# Patient Record
Sex: Female | Born: 1960 | Race: Black or African American | Hispanic: No | Marital: Single | Smoking: Current every day smoker
Health system: Southern US, Community
[De-identification: ages and names within clinical notes are randomized; demographics above are authoritative.]

## PROBLEM LIST (undated history)

## (undated) DIAGNOSIS — J45909 Unspecified asthma, uncomplicated: Secondary | ICD-10-CM

## (undated) DIAGNOSIS — I10 Essential (primary) hypertension: Secondary | ICD-10-CM

## (undated) DIAGNOSIS — K219 Gastro-esophageal reflux disease without esophagitis: Secondary | ICD-10-CM

## (undated) HISTORY — PX: TOE SURGERY: SHX1073

## (undated) HISTORY — PX: ABDOMINAL HYSTERECTOMY: SHX81

---

## 2006-01-09 ENCOUNTER — Ambulatory Visit: Payer: Self-pay | Admitting: Family Medicine

## 2006-05-04 ENCOUNTER — Ambulatory Visit: Payer: Self-pay | Admitting: Internal Medicine

## 2006-05-11 ENCOUNTER — Ambulatory Visit (HOSPITAL_COMMUNITY): Admission: RE | Admit: 2006-05-11 | Discharge: 2006-05-11 | Payer: Self-pay | Admitting: Family Medicine

## 2006-05-20 ENCOUNTER — Other Ambulatory Visit: Admission: RE | Admit: 2006-05-20 | Discharge: 2006-05-20 | Payer: Self-pay | Admitting: Family Medicine

## 2006-05-20 ENCOUNTER — Ambulatory Visit: Payer: Self-pay | Admitting: Family Medicine

## 2006-05-20 ENCOUNTER — Encounter (INDEPENDENT_AMBULATORY_CARE_PROVIDER_SITE_OTHER): Payer: Self-pay | Admitting: Family Medicine

## 2006-06-01 ENCOUNTER — Encounter: Admission: RE | Admit: 2006-06-01 | Discharge: 2006-06-01 | Payer: Self-pay | Admitting: Family Medicine

## 2007-06-28 ENCOUNTER — Ambulatory Visit (HOSPITAL_COMMUNITY): Admission: RE | Admit: 2007-06-28 | Discharge: 2007-06-28 | Payer: Self-pay | Admitting: Family Medicine

## 2007-08-03 ENCOUNTER — Ambulatory Visit: Payer: Self-pay | Admitting: Internal Medicine

## 2007-09-17 ENCOUNTER — Ambulatory Visit: Payer: Self-pay | Admitting: Internal Medicine

## 2007-09-20 ENCOUNTER — Ambulatory Visit: Payer: Self-pay | Admitting: Internal Medicine

## 2010-03-10 ENCOUNTER — Emergency Department (HOSPITAL_COMMUNITY): Admission: EM | Admit: 2010-03-10 | Discharge: 2010-03-10 | Payer: Self-pay | Admitting: Family Medicine

## 2010-06-06 ENCOUNTER — Other Ambulatory Visit (HOSPITAL_COMMUNITY): Payer: Self-pay | Admitting: Family Medicine

## 2010-06-06 DIAGNOSIS — Z1231 Encounter for screening mammogram for malignant neoplasm of breast: Secondary | ICD-10-CM

## 2010-06-06 DIAGNOSIS — Z1239 Encounter for other screening for malignant neoplasm of breast: Secondary | ICD-10-CM

## 2010-06-24 ENCOUNTER — Ambulatory Visit (HOSPITAL_COMMUNITY)
Admission: RE | Admit: 2010-06-24 | Discharge: 2010-06-24 | Disposition: A | Discharge status: Home / Self Care | Payer: Self-pay | Source: Ambulatory Visit | Attending: Family Medicine | Admitting: Family Medicine

## 2010-06-24 ENCOUNTER — Encounter (HOSPITAL_COMMUNITY): Payer: Self-pay

## 2010-06-24 DIAGNOSIS — D739 Disease of spleen, unspecified: Secondary | ICD-10-CM | POA: Insufficient documentation

## 2010-06-24 DIAGNOSIS — Z1231 Encounter for screening mammogram for malignant neoplasm of breast: Secondary | ICD-10-CM | POA: Insufficient documentation

## 2010-06-24 DIAGNOSIS — D1779 Benign lipomatous neoplasm of other sites: Secondary | ICD-10-CM | POA: Insufficient documentation

## 2010-06-24 DIAGNOSIS — J9819 Other pulmonary collapse: Secondary | ICD-10-CM | POA: Insufficient documentation

## 2010-06-24 MED ORDER — IOHEXOL 300 MG/ML  SOLN
100.0000 mL | Freq: Once | INTRAMUSCULAR | Status: AC | PRN
  Administered 2010-06-24: 85 mL via INTRAVENOUS

## 2011-01-14 ENCOUNTER — Inpatient Hospital Stay (INDEPENDENT_AMBULATORY_CARE_PROVIDER_SITE_OTHER)
Admission: RE | Admit: 2011-01-14 | Discharge: 2011-01-14 | Disposition: A | Discharge status: Home / Self Care | Payer: Self-pay | Source: Ambulatory Visit | Attending: Family Medicine | Admitting: Family Medicine

## 2011-01-14 DIAGNOSIS — M461 Sacroiliitis, not elsewhere classified: Secondary | ICD-10-CM

## 2011-08-11 ENCOUNTER — Emergency Department (INDEPENDENT_AMBULATORY_CARE_PROVIDER_SITE_OTHER)
Admission: EM | Admit: 2011-08-11 | Discharge: 2011-08-11 | Disposition: A | Discharge status: Home / Self Care | Payer: Self-pay | Source: Home / Self Care

## 2011-08-11 ENCOUNTER — Encounter (HOSPITAL_COMMUNITY): Payer: Self-pay

## 2011-08-11 DIAGNOSIS — J209 Acute bronchitis, unspecified: Secondary | ICD-10-CM

## 2011-08-11 DIAGNOSIS — D1809 Hemangioma of other sites: Secondary | ICD-10-CM

## 2011-08-11 DIAGNOSIS — D1803 Hemangioma of intra-abdominal structures: Secondary | ICD-10-CM

## 2011-08-11 MED ORDER — BENZONATATE 100 MG PO CAPS: ORAL_CAPSULE | ORAL | Status: AC

## 2011-08-11 MED ORDER — BENZONATATE 100 MG PO CAPS: ORAL_CAPSULE | ORAL | Status: DC

## 2011-08-11 MED ORDER — SULFAMETHOXAZOLE-TRIMETHOPRIM 800-160 MG PO TABS: 1.0000 | ORAL_TABLET | Freq: Two times a day (BID) | ORAL | Status: AC

## 2011-08-11 MED ORDER — SULFAMETHOXAZOLE-TRIMETHOPRIM 800-160 MG PO TABS: 1.0000 | ORAL_TABLET | Freq: Two times a day (BID) | ORAL | Status: DC

## 2011-08-11 NOTE — ED Notes (Addendum)
C/o 5 day duration of cough, beginning to be productive yesterday (green w red streaks) had come down for [place] a few days ago, and thought it was a change in the weather making her have cough. No known ill contacts

## 2011-08-11 NOTE — Discharge Instructions (Signed)
Increase fluids and rest. Tylenol or Ibuprofen as needed. As we discussed, you should have a follow up CT scan. The CT scan you had Feb 2012 showed a probable benign lesion (non-cancerous) on your spleen. We will contact [facility] to schedule this for you.  Return as needed. Stop smoking!   Acute Bronchitis Bronchitis is when the organs and tissues involved in breathing get puffy (swollen) and can leak fluid. This makes it harder for air to get in and out of the lungs. You may cough a lot and produce thick spit (mucus). Acute means the illness started suddenly. HOME CARE  Rest.   Drink enough fluids to keep the pee (urine) clear or pale yellow.   Medicines may be given that will open up your airways to help you breathe better. Only take medicine as told by your doctor.   Use a cool mist vaporizer. This will help to thin any thick spit.   Do not smoke. Avoid secondhand smoke.  GET HELP RIGHT AWAY IF:   You have a temperature by mouth above 102 F (38.9 C), not controlled by medicine.   You have chills.   You develop severe shortness of breath or chest pain.   You have bloody spit mixed with mucus (sputum).   You throw up (vomit) often.   You lose too much body fluid (dehydrated).   You have a severe headache.   You feel faint.   You do not improve after 1 week of treatment.  MAKE SURE YOU:   Understand these instructions.   Will watch your condition.   Will get help right away if you are not doing well or get worse.  Document Released: 10/08/2007 Document Revised: 04/10/2011 Document Reviewed: 05/09/2009 [facility] Patient Information 2012 [place], [place].

## 2011-08-11 NOTE — ED Provider Notes (Signed)
Medical screening examination/treatment/procedure(s) were performed by non-physician practitioner and as supervising physician I was immediately available for consultation/collaboration.  Karely Hurtado   Dhyana Bastone, MD 08/11/11 1325 

## 2011-08-11 NOTE — ED Provider Notes (Signed)
History     CSN: [id number]  Arrival date & time 08/11/11  4098   None     Chief Complaint  Patient presents with  . Cough    (Consider location/radiation/quality/duration/timing/severity/associated sxs/prior treatment) HPI Comments: Patient presents today with complaints of sore throat and cough. Symptoms began a five days ago and are progressively worsening. She denies nasal congestion or postnasal drainage. She states her sore throat is worse with coughing and swallowing. Her cough today has become productive with yellowish green sputum. She has been taking Mucinex and NyQuil without improvement. She is a smoker. She denies fever.   History reviewed. No pertinent past medical history.  History reviewed. No pertinent past surgical history.  History reviewed. No pertinent family history.  History  Substance Use Topics  . Smoking status: Current Everyday Smoker  . Smokeless tobacco: Not on file  . Alcohol Use: Yes    OB History    Grav Para Term Preterm Abortions TAB SAB Ect Mult Living                  Review of Systems  Constitutional: Negative for fever, chills and fatigue.  HENT: Positive for sore throat. Negative for ear pain, congestion, rhinorrhea and sinus pressure.   Respiratory: Positive for cough. Negative for shortness of breath and wheezing.     Allergies  Review of patient's allergies indicates no known allergies.  Home Medications   Current Outpatient Rx  Name Route Sig Dispense Refill  . BENZONATATE 100 MG PO CAPS  1-2 caps every 8 hrs prn cough 30 capsule 0  . SULFAMETHOXAZOLE-TRIMETHOPRIM 800-160 MG PO TABS Oral Take 1 tablet by mouth every 12 (twelve) hours. 14 tablet 0    BP 119/80  Pulse 88  Temp(Src) 98.5 F (36.9 C) (Oral)  Resp 14  SpO2 100%  Physical Exam  Nursing note and vitals reviewed. Constitutional: She appears well-developed and well-nourished. No distress.  HENT:  Head: Normocephalic and atraumatic.  Right Ear: Tympanic  membrane, external ear and ear canal normal.  Left Ear: Tympanic membrane, external ear and ear canal normal.  Nose: Nose normal.  Mouth/Throat: Uvula is midline, oropharynx is clear and moist and mucous membranes are normal. No oropharyngeal exudate, posterior oropharyngeal edema or posterior oropharyngeal erythema.  Neck: Neck supple.  Cardiovascular: Normal rate, regular rhythm and normal heart sounds.   Pulmonary/Chest: Effort normal and breath sounds normal. No respiratory distress.  Lymphadenopathy:    She has no cervical adenopathy.  Neurological: She is alert.  Skin: Skin is warm and dry.  Psychiatric: She has a normal mood and affect.    ED Course  Procedures (including critical care time)  Labs Reviewed - No data to display No results found.   1. Acute bronchitis   2. Hemangioma of spleen       MDM  Previous chart reviewed. CT chest 06/24/10 reviewed & noted to have recommended f/u in 6 mos. Pt states CT was originally ordered by [facility]. She has not had recent appt with them and has not had f/u CT ordered.  Will contact [facility] to order f/u CT chest.         Melody Comas, PA 08/11/11 1143

## 2011-08-12 ENCOUNTER — Telehealth (HOSPITAL_COMMUNITY): Payer: Self-pay | Admitting: *Deleted

## 2011-08-12 NOTE — ED Notes (Signed)
Pt. called back and informed of appointment tomorrow at [facility] and instructed what to bring. I asked pt. if her orange card is up to date and she said it is. I told her I would fax the CT report over to [facility] to make sure they have it. Pt. voiced understanding. Vassie Moselle 08/12/2011

## 2011-08-12 NOTE — ED Notes (Signed)
Referral request with appointment date and time and copy of CT results from 06/24/2010 faxed to [facility].  Confirmation received. Vassie Moselle 08/12/2011]

## 2011-08-12 NOTE — ED Notes (Signed)
4/8 Michiel Cowboy RN asked me to contact [facility] to order a f/u chest CT with contrast. This was requested by Esperanza Sheets PA.  Pt. had CT chest with contrast 06/24/10 and pt. told PA no one told her she was supposed to get it rechecked in 6 mos. 4/9 I called the back desk at [facility] and she said pt. has not been seen in over 1 year.  Their doctor would not feel comfortable ordering it without seeing the pt. No appointments available until May. She transferred me to [facility] the triage RN. Appointment scheduled for 4/10 @ 0945. Tell pt. to bring all of her medicines with her and her orange card. If card is not up to date she would have to pay $75.00. I called pt. and left message to call. Vassie Moselle 08/12/2011

## 2011-08-13 ENCOUNTER — Other Ambulatory Visit (HOSPITAL_COMMUNITY): Payer: Self-pay | Admitting: Family Medicine

## 2011-08-13 DIAGNOSIS — Z1231 Encounter for screening mammogram for malignant neoplasm of breast: Secondary | ICD-10-CM

## 2011-08-18 ENCOUNTER — Ambulatory Visit (HOSPITAL_COMMUNITY)
Admission: RE | Admit: 2011-08-18 | Discharge: 2011-08-18 | Disposition: A | Discharge status: Home / Self Care | Payer: Self-pay | Source: Ambulatory Visit | Attending: Family Medicine | Admitting: Family Medicine

## 2011-08-18 DIAGNOSIS — D1779 Benign lipomatous neoplasm of other sites: Secondary | ICD-10-CM | POA: Insufficient documentation

## 2011-08-18 DIAGNOSIS — D739 Disease of spleen, unspecified: Secondary | ICD-10-CM | POA: Insufficient documentation

## 2011-08-18 LAB — CREATININE, SERUM
Creatinine, Ser: 0.74 mg/dL (ref 0.50–1.10)
GFR calc non Af Amer: >90 mL/min (ref >90)

## 2011-08-18 LAB — BUN: BUN: 12 mg/dL (ref 6–23)

## 2011-08-18 MED ORDER — IOHEXOL 300 MG/ML  SOLN
100.0000 mL | Freq: Once | INTRAMUSCULAR | Status: AC | PRN
  Administered 2011-08-18: 85 mL via INTRAVENOUS

## 2011-09-09 ENCOUNTER — Ambulatory Visit (HOSPITAL_COMMUNITY)
Admission: RE | Admit: 2011-09-09 | Discharge: 2011-09-09 | Disposition: A | Discharge status: Home / Self Care | Payer: Self-pay | Source: Ambulatory Visit | Attending: Family Medicine | Admitting: Family Medicine

## 2011-09-09 DIAGNOSIS — Z1231 Encounter for screening mammogram for malignant neoplasm of breast: Secondary | ICD-10-CM | POA: Insufficient documentation

## 2012-07-23 ENCOUNTER — Emergency Department (HOSPITAL_COMMUNITY): Payer: BC Managed Care – PPO

## 2012-07-23 ENCOUNTER — Observation Stay (HOSPITAL_COMMUNITY)
Admission: EM | Admit: 2012-07-23 | Discharge: 2012-07-26 | Disposition: A | Discharge status: Home / Self Care | Payer: BC Managed Care – PPO | Attending: Internal Medicine | Admitting: Internal Medicine

## 2012-07-23 ENCOUNTER — Encounter (HOSPITAL_COMMUNITY): Payer: Self-pay | Admitting: Emergency Medicine

## 2012-07-23 DIAGNOSIS — R0602 Shortness of breath: Secondary | ICD-10-CM | POA: Insufficient documentation

## 2012-07-23 DIAGNOSIS — R079 Chest pain, unspecified: Principal | ICD-10-CM | POA: Diagnosis present

## 2012-07-23 DIAGNOSIS — K220 Achalasia of cardia: Secondary | ICD-10-CM

## 2012-07-23 DIAGNOSIS — R9431 Abnormal electrocardiogram [ECG] [EKG]: Secondary | ICD-10-CM

## 2012-07-23 DIAGNOSIS — R799 Abnormal finding of blood chemistry, unspecified: Secondary | ICD-10-CM | POA: Insufficient documentation

## 2012-07-23 DIAGNOSIS — R1319 Other dysphagia: Secondary | ICD-10-CM

## 2012-07-23 DIAGNOSIS — R6889 Other general symptoms and signs: Secondary | ICD-10-CM | POA: Insufficient documentation

## 2012-07-23 DIAGNOSIS — F17200 Nicotine dependence, unspecified, uncomplicated: Secondary | ICD-10-CM | POA: Insufficient documentation

## 2012-07-23 DIAGNOSIS — R933 Abnormal findings on diagnostic imaging of other parts of digestive tract: Secondary | ICD-10-CM

## 2012-07-23 DIAGNOSIS — R05 Cough: Secondary | ICD-10-CM | POA: Insufficient documentation

## 2012-07-23 DIAGNOSIS — K219 Gastro-esophageal reflux disease without esophagitis: Secondary | ICD-10-CM | POA: Insufficient documentation

## 2012-07-23 DIAGNOSIS — R1310 Dysphagia, unspecified: Secondary | ICD-10-CM

## 2012-07-23 DIAGNOSIS — R059 Cough, unspecified: Secondary | ICD-10-CM | POA: Insufficient documentation

## 2012-07-23 HISTORY — DX: Unspecified asthma, uncomplicated: J45.909

## 2012-07-23 HISTORY — DX: Gastro-esophageal reflux disease without esophagitis: K21.9

## 2012-07-23 LAB — CBC WITH DIFFERENTIAL/PLATELET
Eosinophils Absolute: 0.3 10*3/uL (ref 0.0–0.7)
HCT: 39.2 % (ref 36.0–46.0)
Hemoglobin: 12.7 g/dL (ref 12.0–15.0)
Monocytes Absolute: 0.5 10*3/uL (ref 0.1–1.0)
Monocytes Relative: 4 % (ref 3–12)
Neutro Abs: 6.3 10*3/uL (ref 1.7–7.7)
Platelets: 280 10*3/uL (ref 150–400)
RDW: 14.2 % (ref 11.5–15.5)
WBC: 11.4 10*3/uL — ABNORMAL HIGH (ref 4.0–10.5)

## 2012-07-23 LAB — BASIC METABOLIC PANEL
Creatinine, Ser: 0.8 mg/dL (ref 0.50–1.10)
GFR calc Af Amer: >90 mL/min (ref >90)
Potassium: 4.6 mEq/L (ref 3.5–5.1)
Sodium: 137 mEq/L (ref 135–145)

## 2012-07-23 LAB — POCT I-STAT TROPONIN I: Troponin i, poc: 0.01 ng/mL (ref 0.00–0.08)

## 2012-07-23 MED ORDER — ONDANSETRON HCL 4 MG/2ML IJ SOLN: 4.0000 mg | Freq: Three times a day (TID) | INTRAMUSCULAR | Status: AC | PRN

## 2012-07-23 MED ORDER — MORPHINE SULFATE 2 MG/ML IJ SOLN
2.0000 mg | Freq: Once | INTRAMUSCULAR | Status: AC
  Administered 2012-07-23: 2 mg via INTRAVENOUS
  Filled 2012-07-23: qty 1

## 2012-07-23 MED ORDER — HEPARIN SODIUM (PORCINE) 5000 UNIT/ML IJ SOLN
5000.0000 [IU] | Freq: Three times a day (TID) | INTRAMUSCULAR | Status: AC
  Administered 2012-07-24 (×3): 5000 [IU] via SUBCUTANEOUS
  Filled 2012-07-23 (×4): qty 1

## 2012-07-23 MED ORDER — PANTOPRAZOLE SODIUM 40 MG PO TBEC
80.0000 mg | DELAYED_RELEASE_TABLET | Freq: Every day | ORAL | Status: DC
  Administered 2012-07-23 – 2012-07-26 (×4): 80 mg via ORAL
  Filled 2012-07-23 (×3): qty 2

## 2012-07-23 NOTE — ED Notes (Signed)
Onset 2 weeks ago while eating developed shortness of breath and chest pain for the past week coughing at night while laying down.  Seen a Doctor recently states diagnosed with bronchitis.

## 2012-07-23 NOTE — ED Provider Notes (Signed)
History     CSN: [id number]  Arrival date & time 07/23/12  1731   First MD Initiated Contact with Patient 07/23/12 1945      Chief Complaint  Patient presents with  . Chest Pain  . Shortness of Breath  . Cough    HPI Onset 2 weeks ago while eating developed shortness of breath and chest pain for the past week coughing at night while laying down. Seen a Doctor recently states diagnosed with bronchitis.  History reviewed. No pertinent past medical history.  History reviewed. No pertinent past surgical history.  No family history on file.  History  Substance Use Topics  . Smoking status: Current Every Day Smoker  . Smokeless tobacco: Not on file  . Alcohol Use: Yes    OB History   Grav Para Term Preterm Abortions TAB SAB Ect Mult Living                  Review of Systems  Respiratory: Positive for cough and choking.   All other systems reviewed and are negative.    Allergies  Review of patient's allergies indicates no known allergies.  Home Medications   Current Outpatient Rx  Name  Route  Sig  Dispense  Refill  . chlorpheniramine-HYDROcodone (TUSSIONEX PENNKINETIC ER) 10-8 MG/5ML LQCR   Oral   Take 5 mLs by mouth at bedtime as needed. For cough           BP 125/78  Pulse 73  Temp(Src) 98.7 F (37.1 C) (Oral)  Resp 20  Ht 5\' 5"  (1.651 m)  Wt 195 lb (88.451 kg)  BMI 32.45 kg/m2  SpO2 98%  Physical Exam  Nursing note and vitals reviewed. Constitutional: She is oriented to person, place, and time. She appears well-developed and well-nourished. No distress.  HENT:  Head: Normocephalic and atraumatic.  Eyes: Pupils are equal, round, and reactive to light.  Neck: Normal range of motion.  Cardiovascular: Normal rate and intact distal pulses.   Pulmonary/Chest: No respiratory distress.  Abdominal: Normal appearance. She exhibits no distension. There is no tenderness. There is no rebound.  Musculoskeletal: Normal range of motion.  Neurological: She  is alert and oriented to person, place, and time. No cranial nerve deficit.  Skin: Skin is warm and dry. No rash noted.  Psychiatric: She has a normal mood and affect. Her behavior is normal.    ED Course  Procedures (including critical care time)  Date: 07/23/2012  Rate: 80  Rhythm: normal sinus rhythm  QRS Axis: normal  Intervals: normal  ST/T Wave abnormalities: Inferior and lateral T-wave inversions  Conduction Disutrbances: none  Narrative Interpretation: Abnormal EKG     Labs Reviewed  CBC WITH DIFFERENTIAL - Abnormal; Notable for the following:    WBC 11.4 (*)    Lymphs Abs 4.2 (*)    All other components within normal limits  BASIC METABOLIC PANEL - Abnormal; Notable for the following:    Glucose, Bld 111 (*)    GFR calc non Af Amer 85 (*)    All other components within normal limits  POCT I-STAT TROPONIN I   Dg Chest 2 View  07/23/2012  *RADIOLOGY REPORT*  Clinical Data: Chest pain, shortness of breath and cough  CHEST - 2 VIEW  Comparison: 08/18/2011 chest CT  Findings: The cardiomediastinal silhouette is unremarkable. Mild right middle lobe and basilar scarring again noted. There is no evidence of focal airspace disease, pulmonary edema, suspicious pulmonary nodule/mass, pleural effusion, or pneumothorax. No acute  bony abnormalities are identified.  IMPRESSION: No evidence of acute cardiopulmonary disease.   Original Report Authenticated By: Harmon Pier, M.D.      1. Chest pain   2. Abnormal EKG       MDM          Nelia Shi, MD 07/23/12 2155

## 2012-07-23 NOTE — H&P (Signed)
[facility] History and Physical  Hailea Eaglin XBJ:[id number] DOB: [birth year] DOA: 07/23/2012  Referring physician: ED PCP: Jaclyn Shaggy, MD  Specialists: None  Chief Complaint: Chest pain, cough  HPI: Brooke Guerra is a 52 y.o. female who presents with a chronic history of 2 weeks or more of chest pain after eating, cough when lying down.  Eating makes the chest pain worse, and lying down makes the cough worse, nothing seems to make it better.  She states it is of quality like "food gets stuck in my throat".  She has not been treated with any antiacid medication as of yet nor had endoscopy.  In the ED troponin was negative, EKG did show inferior and lateral T wave inversions and no prior EKG was available for comparison.  Given this the patient admitted for cardiac rule out as well as barium swallow to see if she has esophageal dysfunction (ie stricture).  Review of Systems: 12 systems reviewed and otherwise negative.  History reviewed. No pertinent past medical history. Past Surgical History  Procedure Laterality Date  . Abdominal hysterectomy     Social History:  reports that she has been smoking.  She does not have any smokeless tobacco history on file. She reports that  drinks alcohol. She reports that she does not use illicit drugs.   No Known Allergies  History reviewed. No pertinent family history.  Prior to Admission medications   Medication Sig Start Date End Date Taking? Authorizing Provider  chlorpheniramine-HYDROcodone (TUSSIONEX PENNKINETIC ER) 10-8 MG/5ML LQCR Take 5 mLs by mouth at bedtime as needed. For cough   Yes Historical Provider, MD   Physical Exam: Filed Vitals:   07/23/12 1942 07/23/12 2000 07/23/12 2200 07/23/12 2321  BP: 139/77 125/78 110/68 158/90  Pulse: 69 73 72 89  Temp:    98.2 F (36.8 C)  TempSrc:    Oral  Resp: 14 20 23 18   Height:    5\' 4"  (1.626 m)  Weight:    87.272 kg (192 lb 6.4 oz)  SpO2: 98% 98% 97% 91%    General:  NAD,  resting comfortably in bed Eyes: PEERLA EOMI ENT: mucous membranes moist Neck: supple w/o JVD Cardiovascular: RRR w/o MRG Respiratory: CTA B Abdomen: soft, nt, nd, bs+ Skin: no rash nor lesion Musculoskeletal: MAE, full ROM all 4 extremities Psychiatric: normal tone and affect Neurologic: AAOx3, grossly non-focal  Labs on Admission:  Basic Metabolic Panel:  Recent Labs Lab 07/23/12 1753  NA 137  K 4.6  CL 100  CO2 27  GLUCOSE 111*  BUN 12  CREATININE 0.80  CALCIUM 9.0   Liver Function Tests: No results found for this basename: AST, ALT, ALKPHOS, BILITOT, PROT, ALBUMIN,  in the last 168 hours No results found for this basename: LIPASE, AMYLASE,  in the last 168 hours No results found for this basename: AMMONIA,  in the last 168 hours CBC:  Recent Labs Lab 07/23/12 1753  WBC 11.4*  NEUTROABS 6.3  HGB 12.7  HCT 39.2  MCV 85.2  PLT 280   Cardiac Enzymes: No results found for this basename: CKTOTAL, CKMB, CKMBINDEX, TROPONINI,  in the last 168 hours  BNP (last 3 results) No results found for this basename: PROBNP,  in the last 8760 hours CBG: No results found for this basename: GLUCAP,  in the last 168 hours  Radiological Exams on Admission: Dg Chest 2 View  07/23/2012  *RADIOLOGY REPORT*  Clinical Data: Chest pain, shortness of breath and cough  CHEST -  2 VIEW  Comparison: 08/18/2011 chest CT  Findings: The cardiomediastinal silhouette is unremarkable. Mild right middle lobe and basilar scarring again noted. There is no evidence of focal airspace disease, pulmonary edema, suspicious pulmonary nodule/mass, pleural effusion, or pneumothorax. No acute bony abnormalities are identified.  IMPRESSION: No evidence of acute cardiopulmonary disease.   Original Report Authenticated By: Harmon Pier, M.D.     EKG: Independently reviewed. No prior for comparison, does show inferior and lateral T wave inversions  Assessment/Plan Principal Problem:   Chest pain   1. Chest  pain - clinical history is strongly suspicious for GERD and possibly esophageal strictures.  Treating GERD empirically with PPI, to see if this improves her pain, ordered barium swallow to eval esophagus, as I discussed with patient however, she will likely need GI follow up (either consult during hospital stay or outpatient follow up) for possible EGD both to treat stricture if one is present and to make sure that there isnt any other process going on ie Barett's esophagus / neoplasm.  Have put patient on tele monitor and ordered serial troponins, and keeping NPO just in case we need to do a stress test tomorrow, but given history I doubt that this is cardiac.    Code Status: Full Code (must indicate code status--if unknown or must be presumed, indicate so) Family Communication: Spoke with family members at bedside (indicate person spoken with, if applicable, with phone number if by telephone) Disposition Plan: Admit to obs (indicate anticipated LOS)  Time spent: 50 min  Casen Pryor M. [facility] Pager [phone]  If 7PM-7AM, please contact night-coverage [email or web address] Password [facility] 07/23/2012, 11:29 PM

## 2012-07-24 ENCOUNTER — Observation Stay (HOSPITAL_COMMUNITY): Payer: BC Managed Care – PPO

## 2012-07-24 ENCOUNTER — Other Ambulatory Visit: Payer: Self-pay

## 2012-07-24 DIAGNOSIS — R079 Chest pain, unspecified: Secondary | ICD-10-CM

## 2012-07-24 DIAGNOSIS — R1314 Dysphagia, pharyngoesophageal phase: Secondary | ICD-10-CM

## 2012-07-24 DIAGNOSIS — R933 Abnormal findings on diagnostic imaging of other parts of digestive tract: Secondary | ICD-10-CM

## 2012-07-24 LAB — BASIC METABOLIC PANEL
Calcium: 9 mg/dL (ref 8.4–10.5)
GFR calc non Af Amer: 72 mL/min — ABNORMAL LOW (ref >90)
Sodium: 141 mEq/L (ref 135–145)

## 2012-07-24 LAB — LIPID PANEL: Cholesterol: 185 mg/dL (ref 0–200)

## 2012-07-24 LAB — CBC
MCH: 27.1 pg (ref 26.0–34.0)
MCHC: 31.7 g/dL (ref 30.0–36.0)
Platelets: 239 10*3/uL (ref 150–400)

## 2012-07-24 LAB — TROPONIN I
Troponin I: <0.3 ng/mL (ref <0.30)
Troponin I: <0.3 ng/mL (ref <0.30)

## 2012-07-24 MED ORDER — GI COCKTAIL ~~LOC~~
30.0000 mL | Freq: Once | ORAL | Status: AC
  Administered 2012-07-24: 30 mL via ORAL
  Filled 2012-07-24: qty 30

## 2012-07-24 MED ORDER — NITROGLYCERIN 0.4 MG SL SUBL
0.4000 mg | SUBLINGUAL_TABLET | SUBLINGUAL | Status: DC | PRN
  Administered 2012-07-24 – 2012-07-25 (×2): 0.4 mg via SUBLINGUAL
  Filled 2012-07-24: qty 25

## 2012-07-24 MED ORDER — ACETAMINOPHEN 325 MG PO TABS
650.0000 mg | ORAL_TABLET | ORAL | Status: DC | PRN
  Administered 2012-07-24: 650 mg via ORAL
  Filled 2012-07-24: qty 2

## 2012-07-24 NOTE — Progress Notes (Signed)
[facility] PROGRESS NOTE  Ciin Brazzel ZOX:[id number] DOB: Aug 10, 1960 DOA: 07/23/2012 PCP: Jaclyn Shaggy, MD  Assessment/Plan: 1. Atypical chest pain: cardian enzymes negative. T WAVE inversions on ekg resolving. Echo pending.  2. GERD: on protonix. GI Consult called and plan for EGD in am.  3. dvt prophylaxis  Code Status: full code Family Communication: family at bedside Disposition Plan: possibly in 1 to 2 days.    Consultants:  GI consult called.   HPI/Subjective:   Objective: Filed Vitals:   07/23/12 2000 07/23/12 2200 07/23/12 2321 07/24/12 0548  BP: 125/78 110/68 158/90 138/84  Pulse: 73 72 89 66  Temp:   98.2 F (36.8 C) 98.2 F (36.8 C)  TempSrc:   Oral Oral  Resp: 20 23 18 16   Height:   5\' 4"  (1.626 m)   Weight:   87.272 kg (192 lb 6.4 oz) 86.864 kg (191 lb 8 oz)  SpO2: 98% 97% 91% 96%   No intake or output data in the 24 hours ending 07/24/12 1008 Filed Weights   07/23/12 1748 07/23/12 2321 07/24/12 0548  Weight: 88.451 kg (195 lb) 87.272 kg (192 lb 6.4 oz) 86.864 kg (191 lb 8 oz)    Exam: Alert afebrile comfortable.  Cardiovascular: RRR w/o MRG  Respiratory: CTA B  Abdomen: soft, nt, nd, bs+  Skin: no rash nor lesion  Msculoskeletal: MAE, full ROM all 4 extremities  Neurologic: AAOx3, grossly non-focal   Data Reviewed: Basic Metabolic Panel:  Recent Labs Lab 07/23/12 1753 07/24/12 0500  NA 137 141  K 4.6 3.7  CL 100 104  CO2 27 27  GLUCOSE 111* 99  BUN 12 13  CREATININE 0.80 0.92  CALCIUM 9.0 9.0   Liver Function Tests: No results found for this basename: AST, ALT, ALKPHOS, BILITOT, PROT, ALBUMIN,  in the last 168 hours No results found for this basename: LIPASE, AMYLASE,  in the last 168 hours No results found for this basename: AMMONIA,  in the last 168 hours CBC:  Recent Labs Lab 07/23/12 1753 07/24/12 0500  WBC 11.4* 10.1  NEUTROABS 6.3  --   HGB 12.7 11.9*  HCT 39.2 37.5  MCV 85.2 85.4  PLT 280 239   Cardiac  Enzymes:  Recent Labs Lab 07/23/12 2354 07/24/12 0500  TROPONINI <0.30 <0.30   BNP (last 3 results) No results found for this basename: PROBNP,  in the last 8760 hours CBG: No results found for this basename: GLUCAP,  in the last 168 hours  No results found for this or any previous visit (from the past 240 hour(s)).   Studies: Dg Chest 2 View  07/23/2012  *RADIOLOGY REPORT*  Clinical Data: Chest pain, shortness of breath and cough  CHEST - 2 VIEW  Comparison: 08/18/2011 chest CT  Findings: The cardiomediastinal silhouette is unremarkable. Mild right middle lobe and basilar scarring again noted. There is no evidence of focal airspace disease, pulmonary edema, suspicious pulmonary nodule/mass, pleural effusion, or pneumothorax. No acute bony abnormalities are identified.  IMPRESSION: No evidence of acute cardiopulmonary disease.   Original Report Authenticated By: Harmon Pier, M.D.     Scheduled Meds: . heparin  5,000 Units Subcutaneous Q8H  . pantoprazole  80 mg Oral Daily   Continuous Infusions:   Principal Problem:   Chest pain        [facility]  [facility] Pager [phone]. If 7PM-7AM, please contact night-coverage at [email or web address], password [facility] 07/24/2012, 10:08 AM  LOS: 1 day

## 2012-07-24 NOTE — Consult Note (Signed)
Referring Provider: [facility]  Primary Care Physician:  Jaclyn Shaggy, MD Primary Gastroenterologist:  None.  Reason for Consultation:  Chest pain and dysphagia  HPI: Brooke Guerra is a 52 y.o. female admitted last evening With complaints of chest pain, difficulty swallowing and coughing. She has history of what sounds like chronic bronchitis and is a smoker but has not had any other known chronic medical problems. She has not had any prior GI evaluation. Patient says she has noticed onset of heartburn over the past couple months which she had never had previously and then about 2 weeks ago started having difficulty swallowing. She says she's been having "gagging" with eating and a feeling of her food sitting in her chest for prolonged periods of time. She says she's having difficulty with solids and liquids and when she tries to each she gets a lot of gas L. Ting burping whether it's liquid or solid that she has swallowed . She gets discomfort and pain in her chest when the food sits for a long time.. has not been regurgitating during the day. She says at nighttime or if she lies down she has a lot of reflux and foamy fluid that comes back up in her mouth. She is being wakened from sleep coughing and with fluid coming up. Denies any abdominal pain. She says she's been eating less but is not sure about weight loss. She says the only chest pain that she has had has been associated with swallowing over the past 2 weeks. Initial evaluation in the ER Showed an abnormal EKG with some T wave inversion but no prior EKG for comparison. troponins are negative Barium swallow was done earlier today and this shows tight  narrowing at the GE junction. Esophageal motility proximally appears normal.    History reviewed. No pertinent past medical history.  Past Surgical History  Procedure Laterality Date  . Abdominal hysterectomy      Prior to Admission medications   Medication Sig Start Date End  Date Taking? Authorizing Provider  chlorpheniramine-HYDROcodone (TUSSIONEX PENNKINETIC ER) 10-8 MG/5ML LQCR Take 5 mLs by mouth at bedtime as needed. For cough   Yes Historical Provider, MD    Current Facility-Administered Medications  Medication Dose Route Frequency Provider Last Rate Last Dose  . acetaminophen (TYLENOL) tablet 650 mg  650 mg Oral Q4H PRN Kathlen Mody, MD      . heparin injection 5,000 Units  5,000 Units Subcutaneous Q8H Hillary Bow, DO   5,000 Units at 07/24/12 0609  . nitroGLYCERIN (NITROSTAT) SL tablet 0.4 mg  0.4 mg Sublingual Q5 min PRN Kathlen Mody, MD   0.4 mg at 07/24/12 0843  . pantoprazole (PROTONIX) EC tablet 80 mg  80 mg Oral Daily Hillary Bow, DO   80 mg at 07/24/12 1048    Allergies as of 07/23/2012  . (No Known Allergies)    History reviewed. No pertinent family history.  History   Social History  . Marital Status: Single    Spouse Name: N/A    Number of Children: N/A  . Years of Education: N/A   Occupational History  . Not on file.   Social History Main Topics  . Smoking status: Current Every Day Smoker -- 0.50 packs/day  . Smokeless tobacco: Not on file  . Alcohol Use: Yes  . Drug Use: No  . Sexually Active: Not on file   Other Topics Concern  . Not on file   Social History Narrative  . No narrative on  file    Review of Systems:  Pertinent positive and negative review of systems were noted in the above HPI section.  All other review of systems was otherwise negative.  Physical Exam: Vital signs in last 24 hours: Temp:  [98.1 F (36.7 C)-98.7 F (37.1 C)] 98.1 F (36.7 C) (03/22 1105) Pulse Rate:  [66-89] 82 (03/22 1105) Resp:  [14-23] 18 (03/22 1105) BP: (110-158)/(68-90) 138/84 mmHg (03/22 0548) SpO2:  [91 %-98 %] 96 % (03/22 0548) Weight:  [191 lb 8 oz (86.864 kg)-195 lb (88.451 kg)] 191 lb 8 oz (86.864 kg) (03/22 0548) Last BM Date: 07/24/12 General:   Alert,  Well-developed, well-nourished, African American  female, pleasant and cooperative in NAD Head:  Normocephalic and atraumatic. Eyes:  Sclera clear, no icterus.   Conjunctiva pink. Ears:  Normal auditory acuity. Nose:  No deformity, discharge,  or lesions. Mouth:  No deformity or lesions.   Neck:  Supple; no masses or thyromegaly. Lungs:  Clear throughout to auscultation.   No wheezes, crackles, or rhonchi. Heart:  Regular rate and rhythm; no murmurs, clicks, rubs,  or gallops. Abdomen:  Soft,nontender, BS active,nonpalp mass or hsm.   Rectal:  Deferred  Msk:  Symmetrical without gross deformities. . Pulses:  Normal pulses noted. Extremities:  Without clubbing or edema. Neurologic:  Alert and  oriented x4;  grossly normal neurologically. Skin:  Intact without significant lesions or rashes.. Psych:  Alert and cooperative. Normal mood and affect.  Intake/Output from previous day:   Intake/Output this shift:    Lab Results:  Recent Labs  07/23/12 1753 07/24/12 0500  WBC 11.4* 10.1  HGB 12.7 11.9*  HCT 39.2 37.5  PLT 280 239   BMET  Recent Labs  07/23/12 1753 07/24/12 0500  NA 137 141  K 4.6 3.7  CL 100 104  CO2 27 27  GLUCOSE 111* 99  BUN 12 13  CREATININE 0.80 0.92  CALCIUM 9.0 9.0   LFT No results found for this basename: PROT, ALBUMIN, AST, ALT, ALKPHOS, BILITOT, BILIDIR, IBILI,  in the last 72 hours PT/INR No results found for this basename: LABPROT, INR,  in the last 72 hours Hepatitis Panel No results found for this basename: HEPBSAG, HCVAB, HEPAIGM, HEPBIGM,  in the last 72 hours    Studies/Results: Dg Chest 2 View  07/23/2012  *RADIOLOGY REPORT*  Clinical Data: Chest pain, shortness of breath and cough  CHEST - 2 VIEW  Comparison: 08/18/2011 chest CT  Findings: The cardiomediastinal silhouette is unremarkable. Mild right middle lobe and basilar scarring again noted. There is no evidence of focal airspace disease, pulmonary edema, suspicious pulmonary nodule/mass, pleural effusion, or pneumothorax. No  acute bony abnormalities are identified.  IMPRESSION: No evidence of acute cardiopulmonary disease.   Original Report Authenticated By: Harmon Pier, M.D.    Dg Esophagus  07/24/2012  *RADIOLOGY REPORT*  Clinical Data: The patient, history of reflux  ESOPHAGUS/BARIUM SWALLOW/TABLET STUDY  Technique: Barium swallow was performed using 45 seconds of fluoroscopy.  Effervescent crystals were administered initially followed by thick and subsequently thin barium.  Comparison:  None.  Findings: There is normal esophageal motility.  The distal esophagus is significantly narrowed just above the gastroesophageal junction.  Barium passed through the area of persistent narrowing with relative these.  A barium tablet was administered and did not pass through the gastroesophageal junction despite numerous sips of water.  The remainder of the proximal esophagus is mildly to moderately dilated.  At the area of narrowing, there is no  significant mucosal irregularity to obviously suggest the presence of a mass.  IMPRESSION: Tight narrowing at the gastroesophageal junction and distal esophagus.  Differential diagnostic possibilities include achalasia, fibrosis from chronic gastroesophageal reflux, or, less likely, mass.  Consider further endoscopic evaluation.   Original Report Authenticated By: Esperanza Heir, M.D.     IMPRESSION:  #91 52 year old female with 2 week history of dysphagia both solids and liquids and associated chest pain, and nocturnal reflux and/or regurgitation . Barium swallow earlier today shows a tight narrowing at the GE junction. Rule out esophageal stricture, mass, versus achalasia. #2 abnormal EKG-, normal troponins #3 history bronchitis #4 smoker  PLAN: #1 will schedule patient for upper endoscopy with possible esophageal dilation tomorrow morning with Dr. Rhea Belton. Procedure was discussed in detail with the patient and she is agreeable to proceed. #2 change to a full liquid diet and n.p.o. after  midnight #3 PPI as ordered #4 patient is advised to sit upright for all meals and to remain upright for 1 hour postprandially and also keep the head of her bed elevated about 60 #5 patient may need further cardiac evaluation with stress testing etc.-Deferred to primary team. If it is felt that she needs further cardiac evaluation first before proceeding with EGD - please notify us. #6 she is on SQ heparin- I held this after midnight tonight   Amy Esterwood  07/24/2012, 11:33 AM

## 2012-07-24 NOTE — Consult Note (Signed)
Patient seen, examined, and I agree with the above documentation, including the assessment and plan. Agree with upper endoscopy, possible dilation, tomorrow. All achalasia is possible, motility per barium swallow was "normal" which is not consistent with achalasia. Endoscopy will help sort this out Agree with PPI

## 2012-07-24 NOTE — Progress Notes (Signed)
UR Completed Zavien Clubb Graves-Bigelow, RN,BSN [phone]  

## 2012-07-25 ENCOUNTER — Encounter (HOSPITAL_COMMUNITY)
Admission: EM | Disposition: A | Discharge status: Home / Self Care | Payer: Self-pay | Source: Home / Self Care | Attending: Emergency Medicine

## 2012-07-25 ENCOUNTER — Encounter (HOSPITAL_COMMUNITY): Payer: Self-pay | Admitting: *Deleted

## 2012-07-25 DIAGNOSIS — R1310 Dysphagia, unspecified: Secondary | ICD-10-CM

## 2012-07-25 DIAGNOSIS — R072 Precordial pain: Secondary | ICD-10-CM

## 2012-07-25 DIAGNOSIS — K220 Achalasia of cardia: Secondary | ICD-10-CM

## 2012-07-25 HISTORY — PX: ESOPHAGOGASTRODUODENOSCOPY (EGD) WITH ESOPHAGEAL DILATION: SHX5812

## 2012-07-25 SURGERY — ESOPHAGOGASTRODUODENOSCOPY (EGD) WITH ESOPHAGEAL DILATION
Anesthesia: Moderate Sedation

## 2012-07-25 MED ORDER — SODIUM CHLORIDE 0.9 % IV SOLN
INTRAVENOUS | Status: DC
  Administered 2012-07-25: 500 mL via INTRAVENOUS

## 2012-07-25 MED ORDER — MIDAZOLAM HCL 5 MG/ML IJ SOLN
INTRAMUSCULAR | Status: AC
  Filled 2012-07-25: qty 1

## 2012-07-25 MED ORDER — PANTOPRAZOLE SODIUM 40 MG PO TBEC: 40.0000 mg | DELAYED_RELEASE_TABLET | Freq: Two times a day (BID) | ORAL | Status: DC

## 2012-07-25 MED ORDER — MORPHINE SULFATE 2 MG/ML IJ SOLN
2.0000 mg | INTRAMUSCULAR | Status: DC | PRN
  Administered 2012-07-25 (×2): 2 mg via INTRAVENOUS
  Filled 2012-07-25 (×2): qty 1

## 2012-07-25 MED ORDER — FENTANYL CITRATE 0.05 MG/ML IJ SOLN
INTRAMUSCULAR | Status: AC
  Filled 2012-07-25: qty 2

## 2012-07-25 MED ORDER — BUTAMBEN-TETRACAINE-BENZOCAINE 2-2-14 % EX AERO
INHALATION_SPRAY | CUTANEOUS | Status: DC | PRN
  Administered 2012-07-25: 2 via TOPICAL

## 2012-07-25 MED ORDER — GI COCKTAIL ~~LOC~~
30.0000 mL | Freq: Once | ORAL | Status: AC
  Administered 2012-07-25: 30 mL via ORAL
  Filled 2012-07-25: qty 30

## 2012-07-25 MED ORDER — MIDAZOLAM HCL 10 MG/2ML IJ SOLN
INTRAMUSCULAR | Status: DC | PRN
  Administered 2012-07-25 (×2): 2 mg via INTRAVENOUS
  Administered 2012-07-25: 1 mg via INTRAVENOUS
  Administered 2012-07-25: 2 mg via INTRAVENOUS

## 2012-07-25 MED ORDER — FENTANYL CITRATE 0.05 MG/ML IJ SOLN
INTRAMUSCULAR | Status: DC | PRN
  Administered 2012-07-25 (×4): 25 ug via INTRAVENOUS

## 2012-07-25 NOTE — Progress Notes (Signed)
[facility] PROGRESS NOTE  Brooke Guerra YQM:[id number] DOB: 29-Jun-1960 DOA: 07/23/2012 PCP: Jaclyn Shaggy, MD  Assessment/Plan: 1. Atypical chest pain: cardian enzymes negative. T WAVE inversions on ekg resolved. Echo pending.  2. GERD: on protonix. GI Consult called and she underwent EGD this am. Her gastric mucosa is normal. She was found to have dilated and fluid filled esophagus and biopsies were obtained. Tight LES suspicious for achalasia, she will require manometry in the future to diagnose achalasia. Currently she has persistent substernal pain with belching, . Pain control and monitor.  3. dvt prophylaxis  Code Status: full code Family Communication: none at bedside. Disposition Plan: possibly in 1 to 2 days.    Consultants:  GI consult called.   HPI/Subjective: Substernal pain EKG No ischemic changes. Morphine ordered.   Objective: Filed Vitals:   07/25/12 0955 07/25/12 1000 07/25/12 1005 07/25/12 1044  BP: 129/61 124/80 131/80 161/93  Pulse:      Temp:      TempSrc:      Resp: 19 18 20    Height:      Weight:      SpO2: 99% 99% 95%     Intake/Output Summary (Last 24 hours) at 07/25/12 1251 Last data filed at 07/24/12 1413  Gross per 24 hour  Intake    120 ml  Output      0 ml  Net    120 ml   Filed Weights   07/23/12 1748 07/23/12 2321 07/24/12 0548  Weight: 88.451 kg (195 lb) 87.272 kg (192 lb 6.4 oz) 86.864 kg (191 lb 8 oz)    Exam: Alert afebrile in mild distress.  Cardiovascular: RRR w/o MRG  Respiratory: CTA B  Abdomen: soft, nt, nd, bs+  Skin: no rash nor lesion  Msculoskeletal: MAE, full ROM all 4 extremities  Neurologic: AAOx3, grossly non-focal   Data Reviewed: Basic Metabolic Panel:  Recent Labs Lab 07/23/12 1753 07/24/12 0500  NA 137 141  K 4.6 3.7  CL 100 104  CO2 27 27  GLUCOSE 111* 99  BUN 12 13  CREATININE 0.80 0.92  CALCIUM 9.0 9.0   Liver Function Tests: No results found for this basename: AST, ALT, ALKPHOS,  BILITOT, PROT, ALBUMIN,  in the last 168 hours No results found for this basename: LIPASE, AMYLASE,  in the last 168 hours No results found for this basename: AMMONIA,  in the last 168 hours CBC:  Recent Labs Lab 07/23/12 1753 07/24/12 0500  WBC 11.4* 10.1  NEUTROABS 6.3  --   HGB 12.7 11.9*  HCT 39.2 37.5  MCV 85.2 85.4  PLT 280 239   Cardiac Enzymes:  Recent Labs Lab 07/23/12 2354 07/24/12 0500 07/24/12 1100  TROPONINI <0.30 <0.30 <0.30   BNP (last 3 results) No results found for this basename: PROBNP,  in the last 8760 hours CBG: No results found for this basename: GLUCAP,  in the last 168 hours  No results found for this or any previous visit (from the past 240 hour(s)).   Studies: Dg Chest 2 View  07/23/2012  *RADIOLOGY REPORT*  Clinical Data: Chest pain, shortness of breath and cough  CHEST - 2 VIEW  Comparison: 08/18/2011 chest CT  Findings: The cardiomediastinal silhouette is unremarkable. Mild right middle lobe and basilar scarring again noted. There is no evidence of focal airspace disease, pulmonary edema, suspicious pulmonary nodule/mass, pleural effusion, or pneumothorax. No acute bony abnormalities are identified.  IMPRESSION: No evidence of acute cardiopulmonary disease.   Original Report Authenticated  By: Harmon Pier, M.D.    Dg Esophagus  07/24/2012  *RADIOLOGY REPORT*  Clinical Data: The patient, history of reflux  ESOPHAGUS/BARIUM SWALLOW/TABLET STUDY  Technique: Barium swallow was performed using 45 seconds of fluoroscopy.  Effervescent crystals were administered initially followed by thick and subsequently thin barium.  Comparison:  None.  Findings: There is normal esophageal motility.  The distal esophagus is significantly narrowed just above the gastroesophageal junction.  Barium passed through the area of persistent narrowing with relative these.  A barium tablet was administered and did not pass through the gastroesophageal junction despite numerous sips  of water.  The remainder of the proximal esophagus is mildly to moderately dilated.  At the area of narrowing, there is no significant mucosal irregularity to obviously suggest the presence of a mass.  IMPRESSION: Tight narrowing at the gastroesophageal junction and distal esophagus.  Differential diagnostic possibilities include achalasia, fibrosis from chronic gastroesophageal reflux, or, less likely, mass.  Consider further endoscopic evaluation.   Original Report Authenticated By: Esperanza Heir, M.D.     Scheduled Meds: . pantoprazole  80 mg Oral Daily   Continuous Infusions: . sodium chloride 500 mL (07/25/12 0854)    Principal Problem:   Chest pain Active Problems:   Dysphagia, unspecified        [name]  [facility] Pager [phone]. If 7PM-7AM, please contact night-coverage at [email or web address], password [facility] 07/25/2012, 12:51 PM  LOS: 2 days

## 2012-07-25 NOTE — Progress Notes (Signed)
*  PRELIMINARY RESULTS* Echocardiogram 2D Echocardiogram has been performed.  Brooke Guerra 07/25/2012, 12:17 PM

## 2012-07-25 NOTE — Progress Notes (Signed)
See EGD report Okay for d/c from GI standpoint.  Will arrange outpatient esophageal manometry to further investigate and rule out achalasia

## 2012-07-25 NOTE — Progress Notes (Signed)
Referral- PCP Provided pt with contact number for [facility] on dc instructions to call for PCP in this area that accept [employer]. Pt can call toll free number on insurance card and they will mail her a provider list. Isidoro Donning RN CCM Case Mgmt phone [phone]

## 2012-07-25 NOTE — Care Management Utilization Note (Signed)
UR completed 

## 2012-07-25 NOTE — Op Note (Signed)
[facility] [address] [place], [address]   ENDOSCOPY PROCEDURE REPORT  PATIENT: Brooke Guerra, Brooke Guerra  MR#: [id number] BIRTHDATE: 1961/04/11 , 51  yrs. old GENDER: Female ENDOSCOPIST: Beverley Fiedler, MD REFERRED BY:  Hospitalist, [facility] PROCEDURE DATE:  07/25/2012 PROCEDURE:  EGD w/ biopsy ASA CLASS:     Class II INDICATIONS:  Chest pain.   Dysphagia.   abnormal UGI series results. MEDICATIONS: These medications were titrated to patient response per physician's verbal order, Fentanyl 100 mcg IV, Versed, and Versed 7 mg IV TOPICAL ANESTHETIC: Cetacaine Spray  DESCRIPTION OF PROCEDURE: After the risks benefits and alternatives of the procedure were thoroughly explained, informed consent was obtained.  The Pentax Gastroscope [id number] endoscope was introduced through the mouth and advanced to the second portion of the duodenum. Without limitations.  The instrument was slowly withdrawn as the mucosa was fully examined.     ESOPHAGUS:  Dilated and fluid filled entire esophagus to the GE junction.  The esophageal mucosa was slightly edematous and granular likely secondary to fluid/food stasis.  The GE junction was located at approximately 40 cm from the incisors and mild to moderate resistance was met with passage of the standard adult upper endoscope.  No definite stricture was seen, though on withdrawal of the scope a small mucosal tear at the GE junction was noted.  There was no significant bleeding.   Random biopsies were obtained from the mid and distal esophagus with cold forceps given history of dysphagia.  STOMACH: The mucosa of the stomach appeared normal.  DUODENUM: The duodenal mucosa showed no abnormalities in the bulb and second portion of the duodenum.  Retroflexed views revealed no abnormalities.     The scope was then withdrawn from the patient and the procedure completed.  COMPLICATIONS: There were no complications.  ENDOSCOPIC  IMPRESSION: 1.   Dilated and fluid filled entire esophagus; random biopsies obtained; suspicious for achalasia 2.   Tight LES without definite stricture seen; suspicious for achalasia 2.   The mucosa of the stomach appeared normal 3.   The duodenal mucosa showed no abnormalities in the bulb and second portion of the duodenum   RECOMMENDATIONS: 1.  Await pathology results 2.  My office will arrange for you to have an esophageal manometry test performed.  This is an test of you muscle function of your esophagus. 3.  Soft diet.  Remain upright after eating for at least 90 minutes. 4.  MAC for any further GI procedures   eSigned:  Beverley Fiedler, MD 07/25/2012 9:55 AM   CC:The Patient  PATIENT NAME:  [name], [name] MR#: [id number]

## 2012-07-26 ENCOUNTER — Telehealth: Payer: Self-pay | Admitting: *Deleted

## 2012-07-26 ENCOUNTER — Encounter (HOSPITAL_COMMUNITY): Payer: Self-pay | Admitting: Internal Medicine

## 2012-07-26 LAB — GLUCOSE, CAPILLARY: Glucose-Capillary: 101 mg/dL — ABNORMAL HIGH (ref 70–99)

## 2012-07-26 MED ORDER — PANTOPRAZOLE SODIUM 40 MG PO TBEC: 40.0000 mg | DELAYED_RELEASE_TABLET | Freq: Two times a day (BID) | ORAL | Status: DC

## 2012-07-26 MED ORDER — ASPIRIN 81 MG PO CHEW
81.0000 mg | CHEWABLE_TABLET | Freq: Every day | ORAL | Status: DC
  Administered 2012-07-26: 81 mg via ORAL
  Filled 2012-07-26: qty 1

## 2012-07-26 MED ORDER — HYDROCOD POLST-CHLORPHEN POLST 10-8 MG/5ML PO LQCR: 5.0000 mL | Freq: Every evening | ORAL | Status: DC | PRN

## 2012-07-26 MED ORDER — METOPROLOL TARTRATE 12.5 MG HALF TABLET
12.5000 mg | ORAL_TABLET | Freq: Two times a day (BID) | ORAL | Status: DC
  Administered 2012-07-26: 12.5 mg via ORAL
  Filled 2012-07-26 (×2): qty 1

## 2012-07-26 MED ORDER — OXYCODONE HCL 5 MG PO TABS: 5.0000 mg | ORAL_TABLET | Freq: Four times a day (QID) | ORAL | Status: DC | PRN

## 2012-07-26 MED ORDER — METOPROLOL TARTRATE 12.5 MG HALF TABLET: 12.5000 mg | ORAL_TABLET | Freq: Two times a day (BID) | ORAL | Status: DC

## 2012-07-26 NOTE — Progress Notes (Signed)
DC orders received.  Patient stable with no S/S of distress.  Medication and discharge information reviewed with patient.  Patient DC home. Raquell Richer Marie  

## 2012-07-26 NOTE — Consult Note (Signed)
CARDIOLOGY CONSULT NOTE   Patient ID: Brooke Guerra MRN: [id number] DOB/AGE: 50-26-1962 50 y.o.  Admit date: 07/23/2012  Primary Physician   Jaclyn Shaggy, MD Primary Cardiologist   new Reason for Consultation   SOB, ECG changes  Brooke Guerra is a 52 y.o. female with no history of CAD.  She was admitted 3/21 with chest pain, SOB, and cough. She has been evaluated by GI,  EGD (results below) concerning for achalasia. She had some T wave changes on her ECG and some chest pain. Cardiology was asked to evaluate her.  Her chest pain has been intermittent for the past 2 weeks, and she describes it as a burning, squeezing pain. It comes on 2-3 times per day after meals and lasts for a few minutes then it goes away.    Past Medical History  Diagnosis Date  . Asthma   . GERD (gastroesophageal reflux disease)      Past Surgical History  Procedure Laterality Date  . Abdominal hysterectomy    . Toe surgery      toe fx  . Esophagogastroduodenoscopy (egd) with esophageal dilation N/A 07/25/2012    Procedure: ESOPHAGOGASTRODUODENOSCOPY (EGD) WITH ESOPHAGEAL DILATION;  Surgeon: Beverley Fiedler, MD;  Location: [facility] ENDOSCOPY;  Service: Gastroenterology;  Laterality: N/A;    No Known Allergies  I have reviewed the patient's current medications . aspirin  81 mg Oral Daily  . metoprolol tartrate  12.5 mg Oral BID  . pantoprazole  80 mg Oral Daily   . sodium chloride 500 mL (07/25/12 0854)   acetaminophen, morphine injection, nitroGLYCERIN, oxyCODONE  Prior to Admission medications   Medication Sig Start Date End Date Taking? Authorizing Provider  chlorpheniramine-HYDROcodone (TUSSIONEX PENNKINETIC ER) 10-8 MG/5ML LQCR Take 5 mLs by mouth at bedtime as needed. For cough   Yes Historical Provider, MD  pantoprazole (PROTONIX) 40 MG tablet Take 1 tablet (40 mg total) by mouth 2 (two) times daily. 07/25/12   Kathlen Mody, MD     History   Social History  . Marital Status: Single    Spouse  Name: N/A    Number of Children: N/A  . Years of Education: N/A   Occupational History  . Not on file.   Social History Main Topics  . Smoking status: Current Every Day Smoker -- 0.50 packs/day  . Smokeless tobacco: Not on file  . Alcohol Use: Yes  . Drug Use: No  . Sexually Active: Not on file   Other Topics Concern  . Not on file   Social History Narrative  . No narrative on file    No family status information on file.   History reviewed. No pertinent family history.   ROS:  Full 14 point review of systems complete and found to be negative unless listed above.  Physical Exam: Blood pressure 141/84, pulse 61, temperature 98.2 F (36.8 C), temperature source Oral, resp. rate 18, height 5\' 4"  (1.626 m), weight 191 lb 8 oz (86.864 kg), SpO2 98.00%.  General: Well developed, well nourished, female in no acute distress Head: Eyes PERRLA, No xanthomas.   Normocephalic and atraumatic, oropharynx without edema or exudate. Dentition:  Lungs:  Heart: HRRR S1 S2, no rub/gallop, Heart irregular rate and rhythm with S1, S2  murmur. pulses are 2+ extrem.   Neck: No carotid bruits. No lymphadenopathy.  JVD. Abdomen: Bowel sounds present, abdomen soft and non-tender without masses or hernias noted. Msk:  No spine or cva tenderness. No weakness, no joint deformities or effusions. Extremities:  No clubbing or cyanosis.  edema.  Neuro: Alert and oriented X 3. No focal deficits noted. Psych:  Good affect, responds appropriately Skin: No rashes or lesions noted.  Labs:   Lab Results  Component Value Date   WBC 10.1 07/24/2012   HGB 11.9* 07/24/2012   HCT 37.5 07/24/2012   MCV 85.4 07/24/2012   PLT 239 07/24/2012   No results found for this basename: INR,  in the last 72 hours  Recent Labs Lab 07/24/12 0500  NA 141  K 3.7  CL 104  CO2 27  BUN 13  CREATININE 0.92  CALCIUM 9.0  GLUCOSE 99    Recent Labs  07/23/12 2354 07/24/12 0500 07/24/12 1100  TROPONINI <0.30 <0.30 <0.30      Recent Labs  07/23/12 1818  TROPIPOC 0.01   No results found for this basename: probnp   Lab Results  Component Value Date   CHOL 185 07/24/2012   HDL 42 07/24/2012   LDLCALC 114* 07/24/2012   TRIG 147 07/24/2012   EGD: 07/25/2012 ENDOSCOPIC IMPRESSION:  1. Dilated and fluid filled entire esophagus; random biopsies  obtained; suspicious for achalasia  2. Tight LES without definite stricture seen; suspicious for  achalasia  2. The mucosa of the stomach appeared normal  3. The duodenal mucosa showed no abnormalities in the bulb and  second portion of the duodenum   Echo: 07/25/2012 - Left ventricle: The cavity size was normal. Wall thickness was normal. Systolic function was normal. The estimated ejection fraction was in the range of 55% to 60%. Wall motion was normal; there were no regional wall motion abnormalities. - Pulmonary arteries: PA peak pressure: 33mm Hg (S).  ECG: 25-Jul-2012 10:43:02 [name] Normal ECG Since previous tracing 07/24/12 improved anterior T wave changes 15mm/s 6mm/mV 100Hz  8.0.1 12SL 241 HD CID: 1 Referred by: Nelva Nay Confirmed By: Viann Fish MD Vent. rate 61 BPM PR interval 122 ms QRS duration 74 ms QT/QTc 426/428 ms P-R-T axes 47 19 8   Radiology:  Dg Chest 2 View 07/23/2012  *RADIOLOGY REPORT*  Clinical Data: Chest pain, shortness of breath and cough  CHEST - 2 VIEW  Comparison: 08/18/2011 chest CT  Findings: The cardiomediastinal silhouette is unremarkable. Mild right middle lobe and basilar scarring again noted. There is no evidence of focal airspace disease, pulmonary edema, suspicious pulmonary nodule/mass, pleural effusion, or pneumothorax. No acute bony abnormalities are identified.  IMPRESSION: No evidence of acute cardiopulmonary disease.   Original Report Authenticated By: Harmon Pier, M.D.    Dg Esophagus 07/24/2012  *RADIOLOGY REPORT*   Clinical Data: The patient, history of reflux  ESOPHAGUS/BARIUM SWALLOW/TABLET STUDY  Technique: Barium swallow was performed using 45 seconds of fluoroscopy.  Effervescent crystals were administered initially followed by thick and subsequently thin barium.  Comparison:  None.  Findings: There is normal esophageal motility.  The distal esophagus is significantly narrowed just above the gastroesophageal junction.  Barium passed through the area of persistent narrowing with relative these.  A barium tablet was administered and did not pass through the gastroesophageal junction despite numerous sips of water.  The remainder of the proximal esophagus is mildly to moderately dilated.  At the area of narrowing, there is no significant mucosal irregularity to obviously suggest the presence of a mass.  IMPRESSION: Tight narrowing at the gastroesophageal junction and distal esophagus.  Differential diagnostic possibilities include achalasia, fibrosis from chronic gastroesophageal reflux, or, less likely, mass.  Consider further endoscopic evaluation.   Original Report Authenticated By: Esperanza Heir, M.D.     ASSESSMENT AND PLAN:   The patient was seen today by Dr Jens Som, the patient evaluated and the data reviewed.  Principal Problem:   Chest pain Active Problems:   Dysphagia, unspecified   Signed: Briant Sites, Student-PA 07/26/2012 1:34 PM  Theodore Demark, PA-C 07/26/2012, 1:22 PM Co-Sign MD As above, patient seen and examined. Briefly she is a 52 year old female with past medical history of gastroesophageal reflux disease and asthma who I last evaluated for chest pain. Over the past 2 weeks she has had intermittent chest pain. This occurs only with swallowing food. It lasts 1-2 minutes and resolves. It does not radiate there are no associated symptoms. She also has noticed reflux at night and increased cough when she lies flat. EGD this admission showed achalasia. Cardiac enzymes are negative.  Electrocardiogram on admission showed sinus rhythm with inferior lateral T-wave inversion. Followup electrocardiogram showed normalization of ST changes. Echocardiogram shows normal LV function. Note she has dyspnea with more extreme activities but no orthopnea, PND or pedal edema. She has never had exertional chest pain. Her symptoms are most consistent with GI etiology. I do not think further cardiac evaluation is indicated. Agree with therapy for achalasia. Patient counseled on discontinuing her tobacco use. Please call with questions. Olga Millers 1:59 PM

## 2012-07-26 NOTE — Telephone Encounter (Signed)
Hospitalist from [facility] called to schedule pt for EM. Informed her I will schedule and call the pt back d/t they are only done on Mondays at [facility]. Scheduled pt for 08/02/12 at 11am at [facility]. Asked her nurse to pull up the instructions from [facility] and I will call her tomorrow to discuss.

## 2012-07-26 NOTE — Discharge Summary (Signed)
Physician Discharge Summary  Brooke Guerra JYN:[id number] DOB: 09-24-1960 DOA: 07/23/2012  PCP: Jaclyn Shaggy, MD  Admit date: 07/23/2012 Discharge date: 07/26/2012    Recommendations for Outpatient Follow-up:  1. Follow up with Dr Rhea Belton as recommended  Discharge Diagnoses:  Principal Problem:   Chest pain Active Problems:   Dysphagia, unspecified   Discharge Condition: fair  Diet recommendation: soft diet  Filed Weights   07/23/12 1748 07/23/12 2321 07/24/12 0548  Weight: 88.451 kg (195 lb) 87.272 kg (192 lb 6.4 oz) 86.864 kg (191 lb 8 oz)    History of present illness:   Brooke Guerra is a 52 y.o. female who presents with a chronic history of 2 weeks or more of chest pain after eating, cough when lying down. Eating makes the chest pain worse, and lying down makes the cough worse, nothing seems to make it better. She states it is of quality like "food gets stuck in my throat". She has not been treated with any antiacid medication as of yet nor had endoscopy.  In the ED troponin was negative, EKG did show inferior and lateral T wave inversions and no prior EKG was available for comparison. Given this the patient admitted for cardiac rule out as well as barium swallow to see if she has esophageal dysfunction (ie stricture).  Hospital Course:  Atypical chest pain: PROBABLY from achalasia.  cardian enzymes negative. T WAVE inversions on ekg resolved. Echo within normal limits. Cardiology consultation requested , recommended no further cardiac evaluation needed.   GERD: on protonix. GI Consult called and she underwent EGD this am. Her gastric mucosa is normal. She was found to have dilated and fluid filled esophagus and biopsies were obtained. Tight LES suspicious for achalasia, she will require manometry in the future to diagnose achalasia. Pain control and monitor. Follow up with GI as outpatient.    Procedures: EGD: 1. Dilated and fluid filled entire esophagus; random biopsies   obtained; suspicious for achalasia  2. Tight LES without definite stricture seen; suspicious for  achalasia  2. The mucosa of the stomach appeared normal  3. The duodenal mucosa showed no abnormalities in the bulb and  second portion of the duodenum    Consultations:  Cardiology  GI  Discharge Exam: Filed Vitals:   07/25/12 2100 07/26/12 0500 07/26/12 1137 07/26/12 1409  BP: 169/84 145/89 141/84 143/82  Pulse: 72 62 61 60  Temp: 98.2 F (36.8 C) 98.2 F (36.8 C)  98 F (36.7 C)  TempSrc:    Oral  Resp: 20 18  18   Height:      Weight:      SpO2: 96% 98%  100%   Alert afebrile in no  distress.  Cardiovascular: RRR w/o MRG  Respiratory: CTA B  Abdomen: soft, nt, nd, bs+  Skin: no rash nor lesion  Msculoskeletal: MAE, full ROM all 4 extremities  Neurologic: AAOx3, grossly non-focal   Discharge Instructions  Discharge Orders   Future Orders Complete By Expires     Activity as tolerated - No restrictions  As directed     Discharge instructions  As directed     Comments:      Follow up with PCP, GI as recommended.        Medication List    TAKE these medications       chlorpheniramine-HYDROcodone 10-8 MG/5ML Lqcr  Commonly known as:  TUSSIONEX PENNKINETIC ER  Take 5 mLs by mouth at bedtime as needed. For cough     metoprolol  tartrate 12.5 mg Tabs  Commonly known as:  LOPRESSOR  Take 0.5 tablets (12.5 mg total) by mouth 2 (two) times daily.     oxyCODONE 5 MG immediate release tablet  Commonly known as:  Oxy IR/ROXICODONE  Take 1 tablet (5 mg total) by mouth every 6 (six) hours as needed.     pantoprazole 40 MG tablet  Commonly known as:  PROTONIX  Take 1 tablet (40 mg total) by mouth 2 (two) times daily.           Follow-up Information   Follow up with Lonia Blood, MD On 07/29/2012. (@ 11:15 please bring d/c summary from last hospital visit and  co pay amount along with insurance card. )    Contact information:   [address] [place]  [place] [phone]        The results of significant diagnostics from this hospitalization (including imaging, microbiology, ancillary and laboratory) are listed below for reference.    Significant Diagnostic Studies: Dg Chest 2 View  07/23/2012  *RADIOLOGY REPORT*  Clinical Data: Chest pain, shortness of breath and cough  CHEST - 2 VIEW  Comparison: 08/18/2011 chest CT  Findings: The cardiomediastinal silhouette is unremarkable. Mild right middle lobe and basilar scarring again noted. There is no evidence of focal airspace disease, pulmonary edema, suspicious pulmonary nodule/mass, pleural effusion, or pneumothorax. No acute bony abnormalities are identified.  IMPRESSION: No evidence of acute cardiopulmonary disease.   Original Report Authenticated By: Harmon Pier, M.D.    Dg Esophagus  07/24/2012  *RADIOLOGY REPORT*  Clinical Data: The patient, history of reflux  ESOPHAGUS/BARIUM SWALLOW/TABLET STUDY  Technique: Barium swallow was performed using 45 seconds of fluoroscopy.  Effervescent crystals were administered initially followed by thick and subsequently thin barium.  Comparison:  None.  Findings: There is normal esophageal motility.  The distal esophagus is significantly narrowed just above the gastroesophageal junction.  Barium passed through the area of persistent narrowing with relative these.  A barium tablet was administered and did not pass through the gastroesophageal junction despite numerous sips of water.  The remainder of the proximal esophagus is mildly to moderately dilated.  At the area of narrowing, there is no significant mucosal irregularity to obviously suggest the presence of a mass.  IMPRESSION: Tight narrowing at the gastroesophageal junction and distal esophagus.  Differential diagnostic possibilities include achalasia, fibrosis from chronic gastroesophageal reflux, or, less likely, mass.  Consider further endoscopic evaluation.   Original Report Authenticated By: Esperanza Heir, M.D.     Microbiology: No results found for this or any previous visit (from the past 240 hour(s)).   Labs: Basic Metabolic Panel:  Recent Labs Lab 07/23/12 1753 07/24/12 0500  NA 137 141  K 4.6 3.7  CL 100 104  CO2 27 27  GLUCOSE 111* 99  BUN 12 13  CREATININE 0.80 0.92  CALCIUM 9.0 9.0   Liver Function Tests: No results found for this basename: AST, ALT, ALKPHOS, BILITOT, PROT, ALBUMIN,  in the last 168 hours No results found for this basename: LIPASE, AMYLASE,  in the last 168 hours No results found for this basename: AMMONIA,  in the last 168 hours CBC:  Recent Labs Lab 07/23/12 1753 07/24/12 0500  WBC 11.4* 10.1  NEUTROABS 6.3  --   HGB 12.7 11.9*  HCT 39.2 37.5  MCV 85.2 85.4  PLT 280 239   Cardiac Enzymes:  Recent Labs Lab 07/23/12 2354 07/24/12 0500 07/24/12 1100  TROPONINI <0.30 <0.30 <0.30   BNP:  BNP (last 3 results) No results found for this basename: PROBNP,  in the last 8760 hours CBG:  Recent Labs Lab 07/26/12 0727  GLUCAP 101*       Signed:  Amanie Guerra  [facility] 07/26/2012, 2:39 PM

## 2012-07-26 NOTE — Care Management Note (Signed)
    Page 1 of 1   07/26/2012     10:17:32 AM   CARE MANAGEMENT NOTE 07/26/2012  Patient:  Brooke Guerra, Brooke Guerra   Account Number:  [id number]  Date Initiated:  07/26/2012  Documentation initiated by:  GRAVES-BIGELOW,Saori Umholtz  Subjective/Objective Assessment:   Pt admitted with cp and dysphagia. Plan for d/c today.     Action/Plan:   CM provided pt with PCP information and she will call to get that set up. No further needs from CM at this time.   Anticipated DC Date:  07/26/2012   Anticipated DC Plan:  HOME/SELF CARE      DC Planning Services  CM consult      Choice offered to / List presented to:             Status of service:  Completed, signed off Medicare Important Message given?   (If response is "NO", the following Medicare IM given date fields will be blank) Date Medicare IM given:   Date Additional Medicare IM given:    Discharge Disposition:  HOME/SELF CARE  Per UR Regulation:  Reviewed for med. necessity/level of care/duration of stay  If discussed at Long Length of Stay Meetings, dates discussed:    Comments:

## 2012-07-27 ENCOUNTER — Encounter: Payer: Self-pay | Admitting: Internal Medicine

## 2012-07-29 NOTE — Telephone Encounter (Signed)
lmom for pt to call back

## 2012-07-29 NOTE — Telephone Encounter (Signed)
Spoke with pt and we went over her EM instructions. Pt stated understanding and had already reviewed the information.

## 2012-08-02 ENCOUNTER — Ambulatory Visit (HOSPITAL_COMMUNITY)
Admission: RE | Admit: 2012-08-02 | Discharge: 2012-08-02 | Disposition: A | Discharge status: Home / Self Care | Payer: BC Managed Care – PPO | Source: Ambulatory Visit | Attending: Internal Medicine | Admitting: Internal Medicine

## 2012-08-02 ENCOUNTER — Telehealth: Payer: Self-pay | Admitting: *Deleted

## 2012-08-02 ENCOUNTER — Encounter (HOSPITAL_COMMUNITY)
Admission: RE | Disposition: A | Discharge status: Home / Self Care | Payer: Self-pay | Source: Ambulatory Visit | Attending: Internal Medicine

## 2012-08-02 ENCOUNTER — Encounter (HOSPITAL_COMMUNITY): Payer: Self-pay

## 2012-08-02 DIAGNOSIS — K220 Achalasia of cardia: Secondary | ICD-10-CM

## 2012-08-02 DIAGNOSIS — R1310 Dysphagia, unspecified: Secondary | ICD-10-CM | POA: Insufficient documentation

## 2012-08-02 HISTORY — PX: ESOPHAGEAL MANOMETRY: SHX5429

## 2012-08-02 SURGERY — MANOMETRY, ESOPHAGUS

## 2012-08-02 MED ORDER — LIDOCAINE VISCOUS 2 % MT SOLN
OROMUCOSAL | Status: AC
  Filled 2012-08-02: qty 15

## 2012-08-02 NOTE — Telephone Encounter (Signed)
Informed pt of Dr Lauro Franklin findings and recommendations. She will forego our visit and see Dr Abbey Chatters. Explained to pt I have sent a request to a scheduler at [facility] for an appt; pt stated understanding.

## 2012-08-02 NOTE — Telephone Encounter (Signed)
Message copied by Florene Glen on Mon Aug 02, 2012  5:42 PM ------      Message from: Beverley Fiedler      Created: Mon Aug 02, 2012  5:12 PM       Please notify patient that her esophageal manometry proves achalasia.      This is a new diagnosis for her      She needs to be referred to Dr. Avel Peace for consideration of [facility] myotomy      She may want to be seen by me in clinic to discuss, which is fine by me.      JMP ------

## 2012-08-03 ENCOUNTER — Encounter (HOSPITAL_COMMUNITY): Payer: Self-pay | Admitting: Internal Medicine

## 2012-08-03 ENCOUNTER — Encounter: Payer: Self-pay | Admitting: *Deleted

## 2012-08-03 ENCOUNTER — Encounter: Payer: Self-pay | Admitting: Internal Medicine

## 2012-08-03 NOTE — Telephone Encounter (Signed)
lmom for pt to call back; she has an appt with Dr Abbey Chatters for 08/23/12 at 1:30pm.

## 2012-08-03 NOTE — Telephone Encounter (Signed)
No, waiting will NOT make her worse.  If she would be more comfortable, I am happy to see her in clinic to further discuss achalasia Please print out the article from UpToDate, titled, "Patient information: Achalasia (The Basics)" and give to the patient. Thanks

## 2012-08-03 NOTE — Telephone Encounter (Signed)
duplicate

## 2012-08-03 NOTE — Telephone Encounter (Signed)
Spoke with pt about an appt, but Dr Rhea Belton has none available until after her visit with Dr Rhea Belton. I will look for a cancellation and in the meantime. Mailed her info on Achalasia and Dr Rhea Belton states pt may she a PA if she doesn't understand the info I send.

## 2012-08-03 NOTE — Telephone Encounter (Signed)
Informed pt of her appt with Dr Abbey Chatters for possible [facility] Myotomy and we discussed achalasia. Pt wants to know if waiting to see Dr Abbey Chatters on 08/23/12 is going to make her worse? Thanks.

## 2012-08-23 ENCOUNTER — Encounter (INDEPENDENT_AMBULATORY_CARE_PROVIDER_SITE_OTHER): Payer: Self-pay | Admitting: General Surgery

## 2012-08-23 ENCOUNTER — Ambulatory Visit (INDEPENDENT_AMBULATORY_CARE_PROVIDER_SITE_OTHER): Payer: BC Managed Care – PPO | Admitting: General Surgery

## 2012-08-23 VITALS — BP 128/92 | HR 95 | Temp 96.1°F | Ht 64.0 in | Wt 186.6 lb

## 2012-08-23 DIAGNOSIS — K220 Achalasia of cardia: Secondary | ICD-10-CM

## 2012-08-23 NOTE — Patient Instructions (Signed)
See dietary instructions below.  [facility]   EATING AFTER YOUR ESOPHAGEAL SURGERY  After your esophageal surgery, you can expect some difficulty swallowing.  If food sticks when you eat, it is called "dysphagia".  This is due to swelling around your surgery site and will most likely resolve within a few weeks.  To help you through this temporary phase, we start you out on a pureed diet.  Your first meal in the hospital was clear liquids.  You should have been given a pureed diet by the time you left the hospital.  We ask patients to stay on a pureed diet for the first two weeks to avoid anything getting "stuck" near your recent surgery.  Don't be alarmed if your ability to swallow doesn't progress according to this plan.  Everyone is different and some take longer or shorter.  Use common sense.  If you are having trouble swallowing a particular food, then avoid it.  If food is sticking when you advance your diet, go back to the previous day or two.  In general some simple rules to follow are:  Maintain an upright position (as near 90 degrees as possible) whenever eating or drinking.  Take small bites - only 1/2 to 1 teaspoon at a time.  Eat slowly.  It may also help to eat only one food at a time.  Avoid talking while eating.  Do not mix solid foods and liquids in the same mouthful and do not "wash foods down" with liquids, unless you have been instructed to do so by your surgeon.  Eat in a relaxed atmosphere, with no distractions.  Following each meal, sit in an upright position (90 degree angle) for 30 to 45 minutes.  Avoid carbonated (bubbly) drinks.  If food does stick, don't panic.  Try to relax and let the food pass on its own.  Sipping strong hot black tea can also help.  If you have any questions please call our office at [phone].   LEVEL 1 PUREED FOODS:  1ST 2 WEEKS AFTER SURGERY Foods in this group are pureed or blenderized to a smooth, mashed  potato-like consistency.  If necessary, the pureed foods can keep their shape with the addition of a thickening agent.  Meat should be pureed to a smooth pasty consistency.  Hot broth or gravy may be added to the pureed meat, approximately 1 oz. of liquid per 3 oz. serving of meat. CAUTION:  If any foods do not puree into a smooth consistency, it may make eating for swallowing more difficult.  For example, zucchini seeds sometimes do not blend well. Hot Foods Cold Foods  Pureed scrambled eggs and cheese Pureed cottage cheese  Baby cereals Thickened juices and nectars  Thinned cooked cereals (no lumps) Thickened milk or eggnog  Pureed [place] toast or pancakes Ensure  Mashed potatoes Ice cream  Pureed parsley, au gratin, scalloped potatoes, candied sweet potatoes Fruit or [place] ice, sherbet  Pureed buttered or alfredo noodles Plain yogurt  Pureed vegetables (no corn or peas) Instant breakfast  Pureed soups and creamed soups Smooth pudding, mousse, custard  Pureed scalloped apples Whipped gelatin  Gravies Sugar, syrup, honey, jelly  Sauces, cheese, tomato, barbecue, white, creamed Cream  Any baby food Creamer  Alcohol in moderation (not beer or champagne) Margarine  Coffee or tea Mayonnaise   Ketchup, mustard   Apple sauce   SAMPLE MENU:  PUREED DIET Breakfast Lunch Dinner   Orange juice, 1/2 cup  Cream of  wheat, 1/2 cup  Pineapple juice, 1/2 cup  Pureed [place], barley soup, 3/4 cup  Pureed Hawaiian chicken, 3 oz   Scrambled eggs, mashed or blended with cheese, 1/2 cup  Tea or coffee, 1 cup   Whole milk, 1 cup   Non-dairy creamer, 2 Tbsp.  Mashed potatoes, 1/2 cup  Pureed cooled broccoli, 1/2 cup  Apple sauce, 1/2 cup  Coffee or tea  Mashed potatoes, 1/2 cup  Pureed spinach, 1/2 cup  Frozen yogurt, 1/2 cup  Tea or coffee    LEVEL 2 After your first 2 weeks, you can advance to a soft diet.  Keep on this diet until everything goes down easily. Hot Foods Cold  Foods  White fish Cottage cheese  Stuffed fish Junior baby fruit  Baby food meals Semi thickened juices  Minced soft cooked, scrambled, poached eggs nectars  Souffle & omelets Ripe mashed bananas  Cooked cereals Canned fruit, pineapple sauce, milk  potatoes Milkshake  Buttered or Alfredo noodles Custard  Cooked cooled vegetable Puddings, including tapioca  Sherbet Yogurt  Vegetable soup or alphabet soup Fruit ice, [place] ice  Gravies Whipped gelatin  Sugar, syrup, honey, jelly Junior baby desserts  Sauces:  Cheese, creamed, barbecue, tomato, white Cream  Coffee or tea Margarine   SAMPLE MENU:  LEVEL 2 Breakfast Lunch Dinner   Orange juice, 1/2 cup  Oatmeal, 1/2 cup  Scrambled eggs with cheese, 1/2 cup  Decaffeinated tea, 1 cup  Whole milk, 1 cup  Non-dairy creamer, 2 Tbsp  Pineapple juice, 1/2 cup  Minced beef, 3 oz  Gravy, 2 Tbsp  Mashed potatoes, 1/2 cup  Minced fresh broccoli, 1/2 cup  Applesauce, 1/2 cup  Coffee, 1 cup  [place], barley soup, 3/4 cup  Minced Hawaiian chicken, 3 oz  Mashed potatoes, 1/2 cup  Cooked spinach, 1/2 cup  Frozen yogurt, 1/2 cup  Non-dairy creamer, 2 Tbsp    LEVEL 3 After all the foods in level 2 (soft diet) are passing through well you should advance up to the next level.  It is still important to cut these foods into small pieces and eat slowly. Hot Foods Cold Foods  Poultry Cottage cheese  Chopped Swedish meatballs Yogurt  Meat salads (ground or flaked meat) Milk  Flaked fish (tuna) Milkshakes  Poached or scrambled eggs Soft, cold, dry cereal  Souffles and omelets Fruit juices or nectars  Cooked cereals Chopped canned fruit  Chopped [place] toast or pancakes Canned fruit cocktail  Noodles or pasta (no rice) Pudding, mousse, custard  Cooked vegetables (no frozen peas, corn, or mixed vegetables) Green salad  Canned small sweet peas Ice cream  Creamed soup or vegetable soup Fruit ice, [place] ice  Pureed vegetable  soup or alphabet soup Non-dairy creamer  Ground scalloped apples Margarine  Gravies Mayonnaise  Sauces:  Cheese, creamed, barbecue, tomato, white Ketchup  Coffee or tea Mustard   SAMPLE MENU:  LEVEL 3 Breakfast Lunch Dinner   Orange juice, 1/2 cup  Oatmeal, 1/2 cup  Scrambled eggs with cheese, 1/2 cup  Decaffeinated tea, 1 cup  Whole milk, 1 cup  Non-dairy creamer, 2 Tbsp  Ketchup, 1 Tbsp  Margarine, 1 tsp  Salt, 1/4 tsp  Sugar, 2 tsp  Pineapple juice, 1/2 cup  Ground beef, 3 oz  Gravy, 2 Tbsp  Mashed potatoes, 1/2 cup  Cooked spinach, 1/2 cup  Applesauce, 1/2 cup  Decaffeinated coffee  Whole milk  Non-dairy creamer, 2 Tbsp  Margarine, 1 tsp  Salt, 1/4 tsp  Pureed  [place], barley soup, 3/4 cup  Barbecue chicken, 3 oz  Mashed potatoes, 1/2 cup  Ground fresh broccoli, 1/2 cup  Frozen yogurt, 1/2 cup  Decaffeinated tea, 1 cup  Non-dairy creamer, 2 Tbsp  Margarine, 1 tsp  Salt, 1/4 tsp  Sugar, 1 tsp    LEVEL 4:  REGULAR FOODS Foods in this group are soft, moist, regularly textured foods.  This level includes red meat and breads, which tend to be the hardest things to swallow.  Eat very slow, chew well and continue to avoid carbonated drinks. Hot Foods Cold Foods  Baked fish or skinned Soft cheeses - cottage cheese  Souffles and omelets Cream cheese  Eggs Yogurt  Stuffed shells Milk  Spaghetti with meat sauce Milkshakes  Cooked cereal Cold dry cereals (no nuts, dried fruit, coconut)  [place] toast or pancakes Crackers  Buttered toast Fruit juices or nectars  Noodles or pasta (no rice) Canned fruit  Potatoes (all types) Ripe bananas  Soft, cooked vegetables (no corn, lima, or baked beans) Peeled, ripe, fresh fruit  Creamed soups or vegetable soup Cakes (no nuts, dried fruit, coconut)  Canned chicken noodle soup Plain doughnuts  Gravies Ice cream  Bacon dressing Pudding, mousse, custard  Sauces:  Cheese, creamed, barbecue, tomato, white  Fruit ice, [place] ice, sherbet  Decaffeinated tea or coffee Whipped gelatin  Pork chops Regular gelatin   Canned fruited gelatin molds   Sugar, syrup, honey, jam, jelly   Cream   Non-dairy   Margarine   Oil   Mayonnaise   Ketchup   Mustard

## 2012-08-23 NOTE — Progress Notes (Signed)
Patient ID: Brooke Guerra, female   DOB: Dec 22, 1960, 52 y.o.   MRN: [id number]  Chief Complaint  Patient presents with  . New Evaluation    eval achalasia    HPI Brooke Guerra is a 52 y.o. female.   HPI  She is referred by Dr. Rhea Belton for further evaluation of achalasia.  One year ago, she had an episode of severe heartburn after eating a hot dog. She said she had been doing well until relatively recently when she began having difficulty with swallowing and severe chest pains. She was regurgitating food. She was having some acid reflux. She ended being seen in the hospital and evaluated for chest pain. An upper GI demonstrated dilation of the esophagus with significant narrowing of the gastroesophageal junction. Upper endoscopy did not demonstrate any masses. Manometry demonstrated an elevated lower esophageal sphincter pressure and aperistalsis of the esophagus consistent with achalasia. She's been sent here to discuss surgical treatment.  Past Medical History  Diagnosis Date  . Asthma   . GERD (gastroesophageal reflux disease)     Past Surgical History  Procedure Laterality Date  . Abdominal hysterectomy    . Toe surgery      toe fx  . Esophagogastroduodenoscopy (egd) with esophageal dilation N/A 07/25/2012    Procedure: ESOPHAGOGASTRODUODENOSCOPY (EGD) WITH ESOPHAGEAL DILATION;  Surgeon: Beverley Fiedler, MD;  Location: [facility] ENDOSCOPY;  Service: Gastroenterology;  Laterality: N/A;  . Esophageal manometry N/A 08/02/2012    Procedure: ESOPHAGEAL MANOMETRY (EM);  Surgeon: Beverley Fiedler, MD;  Location: [facility] ENDOSCOPY;  Service: Gastroenterology;  Laterality: N/A;    Family History  Problem Relation Age of Onset  . Diabetes Sister   . Cancer Sister   . Heart attack Maternal Uncle 22    Social History History  Substance Use Topics  . Smoking status: Current Every Day Smoker -- 0.50 packs/day for 20 years    Types: Cigarettes  . Smokeless tobacco: Not on file  . Alcohol Use: Yes    No  Known Allergies  Current Outpatient Prescriptions  Medication Sig Dispense Refill  . metoprolol tartrate (LOPRESSOR) 12.5 mg TABS Take 0.5 tablets (12.5 mg total) by mouth 2 (two) times daily.  60 tablet  0  . oxyCODONE (OXY IR/ROXICODONE) 5 MG immediate release tablet Take 1 tablet (5 mg total) by mouth every 6 (six) hours as needed.  30 tablet  0  . pantoprazole (PROTONIX) 40 MG tablet Take 1 tablet (40 mg total) by mouth 2 (two) times daily.  60 tablet  0   No current facility-administered medications for this visit.    Review of Systems Review of Systems  Constitutional: Positive for appetite change (Decreased) and unexpected weight change (14 pound weight loss).  HENT: Negative.   Respiratory: Negative.   Cardiovascular: Positive for chest pain.  Gastrointestinal: Negative.   Genitourinary: Negative.   Musculoskeletal: Negative.   Neurological: Negative.   Hematological: Negative.     Blood pressure 128/92, pulse 95, temperature 96.1 F (35.6 C), temperature source Temporal, height 5\' 4"  (1.626 m), weight 186 lb 9.6 oz (84.641 kg), SpO2 96.00%.  Physical Exam Physical Exam  Constitutional: No distress.  Overweight female.  HENT:  Head: Normocephalic and atraumatic.  Eyes: EOM are normal. No scleral icterus.  Neck: Neck supple.  Cardiovascular: Normal rate and regular rhythm.   Pulmonary/Chest: Effort normal and breath sounds normal.  Abdominal: Soft. She exhibits no distension and no mass. There is no tenderness.  Musculoskeletal: She exhibits no edema.  Lymphadenopathy:  She has no cervical adenopathy.  Neurological: She is alert.  Skin: Skin is warm and dry.  Psychiatric: She has a normal mood and affect. Her behavior is normal.    Data Reviewed Dr. Lauro Franklin note.  UGI. EGD. Manometry.  Assessment    Achalasia.  Symptoms are increasing. We discussed the pathology and treatment options of Botox injection, balloon dilatation, and laparoscopic Heller myotomy  with Dor fundoplication.     Plan    Will proceed with laparoscopic Heller myotomy with Dor fundoplication and upper endoscopy as this has the best long-term result.  We discussed that the goal of this operation would be to make her better but it would not make her normal.  The procedure and risks of the operation were explained. Risks include but are not limited to bleeding, infection, wound healing problems, anesthesia, esophageal perforation, injury to the stomach/colon/spleen/liver, failure of operation to improve the symptoms, reflux disease, dysphagia to some foods. We talked about importance of eating certain types of foods and certain behavioral changes with respect to diet.   She seems to understand all this and would like to proceed.       Roshaun Pound J 08/23/2012, 2:14 PM

## 2012-08-24 ENCOUNTER — Encounter (HOSPITAL_COMMUNITY): Payer: Self-pay | Admitting: Pharmacy Technician

## 2012-08-31 ENCOUNTER — Inpatient Hospital Stay (HOSPITAL_COMMUNITY): Admission: RE | Admit: 2012-08-31 | Payer: BC Managed Care – PPO | Source: Ambulatory Visit

## 2012-09-01 ENCOUNTER — Encounter (INDEPENDENT_AMBULATORY_CARE_PROVIDER_SITE_OTHER): Payer: Self-pay

## 2012-09-03 ENCOUNTER — Other Ambulatory Visit (HOSPITAL_COMMUNITY): Payer: Self-pay | Admitting: General Surgery

## 2012-09-03 NOTE — Progress Notes (Signed)
ekg 07-25-2012 epic Chest 2 view xray 07-23-2012 epic

## 2012-09-06 ENCOUNTER — Encounter (HOSPITAL_COMMUNITY): Payer: Self-pay

## 2012-09-06 ENCOUNTER — Encounter (HOSPITAL_COMMUNITY)
Admission: RE | Admit: 2012-09-06 | Discharge: 2012-09-06 | Disposition: A | Discharge status: Home / Self Care | Payer: BC Managed Care – PPO | Source: Ambulatory Visit | Attending: General Surgery | Admitting: General Surgery

## 2012-09-06 HISTORY — DX: Essential (primary) hypertension: I10

## 2012-09-06 LAB — CBC WITH DIFFERENTIAL/PLATELET
Basophils Absolute: 0 10*3/uL (ref 0.0–0.1)
Eosinophils Relative: 1 % (ref 0–5)
Lymphocytes Relative: 28 % (ref 12–46)
Lymphs Abs: 2.5 10*3/uL (ref 0.7–4.0)
MCV: 85.1 fL (ref 78.0–100.0)
Neutro Abs: 6 10*3/uL (ref 1.7–7.7)
Platelets: 307 10*3/uL (ref 150–400)
RBC: 4.5 MIL/uL (ref 3.87–5.11)
RDW: 13.9 % (ref 11.5–15.5)
WBC: 9.1 10*3/uL (ref 4.0–10.5)

## 2012-09-06 LAB — ABO/RH: ABO/RH(D): A POS

## 2012-09-06 LAB — COMPREHENSIVE METABOLIC PANEL
ALT: 6 U/L (ref 0–35)
AST: 10 U/L (ref 0–37)
Alkaline Phosphatase: 88 U/L (ref 39–117)
CO2: 31 mEq/L (ref 19–32)
Calcium: 9.1 mg/dL (ref 8.4–10.5)
Chloride: 101 mEq/L (ref 96–112)
GFR calc Af Amer: 87 mL/min — ABNORMAL LOW (ref >90)
GFR calc non Af Amer: 75 mL/min — ABNORMAL LOW (ref >90)
Glucose, Bld: 93 mg/dL (ref 70–99)
Potassium: 3.6 mEq/L (ref 3.5–5.1)
Sodium: 138 mEq/L (ref 135–145)
Total Bilirubin: 0.4 mg/dL (ref 0.3–1.2)

## 2012-09-06 NOTE — Patient Instructions (Signed)
20      Your procedure is scheduled on:  Tuesday 09/07/2012  Report to [facility] at 0530 AM.  Call this number if you have problems the morning of surgery: [phone]   Remember:             IF YOU USE CPAP,BRING MASK AND TUBING AM OF SURGERY!   Do not eat food or drink liquids AFTER MIDNIGHT!  Take these medicines the morning of surgery with A SIP OF WATER: Metoprolol, Protonix   Do not bring valuables to the hospital.  .  Leave suitcase in the car. After surgery it may be brought to your room.  For patients admitted to the hospital, checkout time is 11:00 AM the day of              Discharge.    DO NOT WEAR JEWELRY , MAKE-UP, LOTIONS,POWDERS,PERFUMES!             WOMEN -DO NOT SHAVE LEGS OR UNDERARMS 12 HRS. BEFORE  SURGERY!               MEN MAY SHAVE AS USUAL!             CONTACTS,DENTURES OR BRIDGEWORK, FALSE EYELASHES MAY NOT BE WORN INTO SURGERY!                                           Patients discharged the day of surgery will not be allowed to drive home. If going home the same day of surgery, must have someone stay with you first 24 hrs.at home and arrange for someone to drive you home from the Hospital.                          YOUR DRIVER IS: Amaryllis Dyke- boyfriend   Special Instructions:             Please read over the following fact sheets that you were given:             1. [place] PREPARING FOR SURGERY SHEET              2.[facility] INFORMATION              3.[facility]                                        Telford Nab.Ayrabella Labombard,RN,BSN     [phone]                FAILURE TO FOLLOW THESE INSTRUCTIONS MAY RESULT IN  CANCELLATION OF YOUR SURGERY!               Patient Signature:___________________________

## 2012-09-07 ENCOUNTER — Inpatient Hospital Stay (HOSPITAL_COMMUNITY)
Admission: RE | Admit: 2012-09-07 | Discharge: 2012-09-10 | DRG: 155 | Disposition: A | Discharge status: Home / Self Care | Payer: BC Managed Care – PPO | Source: Ambulatory Visit | Attending: General Surgery | Admitting: General Surgery

## 2012-09-07 ENCOUNTER — Observation Stay (HOSPITAL_COMMUNITY): Payer: BC Managed Care – PPO

## 2012-09-07 ENCOUNTER — Encounter (HOSPITAL_COMMUNITY): Payer: Self-pay | Admitting: *Deleted

## 2012-09-07 ENCOUNTER — Encounter (HOSPITAL_COMMUNITY)
Admission: RE | Disposition: A | Discharge status: Home / Self Care | Payer: Self-pay | Source: Ambulatory Visit | Attending: General Surgery

## 2012-09-07 ENCOUNTER — Encounter (HOSPITAL_COMMUNITY): Payer: Self-pay | Admitting: Registered Nurse

## 2012-09-07 ENCOUNTER — Ambulatory Visit (HOSPITAL_COMMUNITY): Payer: BC Managed Care – PPO | Admitting: Registered Nurse

## 2012-09-07 DIAGNOSIS — J45909 Unspecified asthma, uncomplicated: Secondary | ICD-10-CM | POA: Diagnosis present

## 2012-09-07 DIAGNOSIS — K220 Achalasia of cardia: Secondary | ICD-10-CM

## 2012-09-07 DIAGNOSIS — K219 Gastro-esophageal reflux disease without esophagitis: Secondary | ICD-10-CM | POA: Diagnosis present

## 2012-09-07 DIAGNOSIS — Z79899 Other long term (current) drug therapy: Secondary | ICD-10-CM

## 2012-09-07 DIAGNOSIS — F17200 Nicotine dependence, unspecified, uncomplicated: Secondary | ICD-10-CM | POA: Diagnosis present

## 2012-09-07 HISTORY — PX: HELLER MYOTOMY: SHX5259

## 2012-09-07 HISTORY — PX: ESOPHAGOGASTRODUODENOSCOPY ENDOSCOPY: SHX5814

## 2012-09-07 SURGERY — ESOPHAGOMYOTOMY, LAPAROSCOPIC, HELLER
Anesthesia: General | Site: Esophagus | Wound class: Clean Contaminated

## 2012-09-07 MED ORDER — BUPIVACAINE-EPINEPHRINE 0.5% -1:200000 IJ SOLN
INTRAMUSCULAR | Status: AC
  Filled 2012-09-07: qty 1

## 2012-09-07 MED ORDER — METOPROLOL TARTRATE 1 MG/ML IV SOLN
2.5000 mg | Freq: Two times a day (BID) | INTRAVENOUS | Status: DC
  Administered 2012-09-07 – 2012-09-09 (×4): 2.5 mg via INTRAVENOUS
  Filled 2012-09-07 (×7): qty 5

## 2012-09-07 MED ORDER — PROMETHAZINE HCL 25 MG/ML IJ SOLN
INTRAMUSCULAR | Status: DC | PRN
  Administered 2012-09-07: .625 mg via INTRAVENOUS

## 2012-09-07 MED ORDER — KCL-LACTATED RINGERS-D5W 20 MEQ/L IV SOLN
INTRAVENOUS | Status: DC
  Administered 2012-09-07 – 2012-09-10 (×6): via INTRAVENOUS
  Filled 2012-09-07 (×9): qty 1000

## 2012-09-07 MED ORDER — LACTATED RINGERS IR SOLN
Status: DC | PRN
  Administered 2012-09-07: 1000 mL

## 2012-09-07 MED ORDER — METOPROLOL TARTRATE 12.5 MG HALF TABLET
12.5000 mg | ORAL_TABLET | Freq: Once | ORAL | Status: AC
  Administered 2012-09-07: 12.5 mg via ORAL
  Filled 2012-09-07: qty 1

## 2012-09-07 MED ORDER — SCOPOLAMINE 1 MG/3DAYS TD PT72
MEDICATED_PATCH | TRANSDERMAL | Status: AC
  Filled 2012-09-07: qty 1

## 2012-09-07 MED ORDER — ONDANSETRON HCL 4 MG/2ML IJ SOLN
4.0000 mg | INTRAMUSCULAR | Status: AC
  Administered 2012-09-07 (×4): 4 mg via INTRAVENOUS
  Filled 2012-09-07 (×4): qty 2

## 2012-09-07 MED ORDER — SCOPOLAMINE 1 MG/3DAYS TD PT72
MEDICATED_PATCH | TRANSDERMAL | Status: DC | PRN
  Administered 2012-09-07: 1 via TRANSDERMAL

## 2012-09-07 MED ORDER — ONDANSETRON HCL 4 MG/2ML IJ SOLN
4.0000 mg | INTRAMUSCULAR | Status: DC | PRN
  Administered 2012-09-08: 4 mg via INTRAVENOUS
  Filled 2012-09-07 (×2): qty 2

## 2012-09-07 MED ORDER — ACETAMINOPHEN 10 MG/ML IV SOLN
1000.0000 mg | Freq: Once | INTRAVENOUS | Status: AC
  Administered 2012-09-07: 1000 mg via INTRAVENOUS
  Filled 2012-09-07: qty 100

## 2012-09-07 MED ORDER — CEFAZOLIN SODIUM-DEXTROSE 2-3 GM-% IV SOLR
INTRAVENOUS | Status: AC
  Filled 2012-09-07: qty 50

## 2012-09-07 MED ORDER — BUPIVACAINE-EPINEPHRINE 0.5% -1:200000 IJ SOLN
INTRAMUSCULAR | Status: DC | PRN
  Administered 2012-09-07: 25 mL

## 2012-09-07 MED ORDER — GLYCOPYRROLATE 0.2 MG/ML IJ SOLN
INTRAMUSCULAR | Status: DC | PRN
  Administered 2012-09-07: 0.6 mg via INTRAVENOUS

## 2012-09-07 MED ORDER — ROCURONIUM BROMIDE 100 MG/10ML IV SOLN
INTRAVENOUS | Status: DC | PRN
  Administered 2012-09-07: 20 mg via INTRAVENOUS
  Administered 2012-09-07: 50 mg via INTRAVENOUS

## 2012-09-07 MED ORDER — MIDAZOLAM HCL 5 MG/5ML IJ SOLN
INTRAMUSCULAR | Status: DC | PRN
  Administered 2012-09-07: 2 mg via INTRAVENOUS

## 2012-09-07 MED ORDER — ACETAMINOPHEN 10 MG/ML IV SOLN
INTRAVENOUS | Status: DC | PRN
  Administered 2012-09-07: 1000 mg via INTRAVENOUS

## 2012-09-07 MED ORDER — DEXAMETHASONE SODIUM PHOSPHATE 10 MG/ML IJ SOLN
INTRAMUSCULAR | Status: DC | PRN
  Administered 2012-09-07: 8 mg via INTRAVENOUS
  Administered 2012-09-07: 4 mg via INTRAVENOUS

## 2012-09-07 MED ORDER — HYDROMORPHONE HCL PF 1 MG/ML IJ SOLN: 0.2500 mg | INTRAMUSCULAR | Status: DC | PRN

## 2012-09-07 MED ORDER — 0.9 % SODIUM CHLORIDE (POUR BTL) OPTIME
TOPICAL | Status: DC | PRN
  Administered 2012-09-07: 1000 mL

## 2012-09-07 MED ORDER — LABETALOL HCL 5 MG/ML IV SOLN
INTRAVENOUS | Status: DC | PRN
  Administered 2012-09-07: 5 mg via INTRAVENOUS

## 2012-09-07 MED ORDER — ACETAMINOPHEN 10 MG/ML IV SOLN: 1000.0000 mg | Freq: Once | INTRAVENOUS | Status: DC | PRN

## 2012-09-07 MED ORDER — MUPIROCIN 2 % EX OINT
TOPICAL_OINTMENT | Freq: Two times a day (BID) | CUTANEOUS | Status: DC
  Administered 2012-09-07: 06:00:00 via NASAL

## 2012-09-07 MED ORDER — ONDANSETRON HCL 4 MG PO TABS: 4.0000 mg | ORAL_TABLET | Freq: Four times a day (QID) | ORAL | Status: DC | PRN

## 2012-09-07 MED ORDER — OXYCODONE HCL 5 MG PO TABS: 5.0000 mg | ORAL_TABLET | Freq: Once | ORAL | Status: DC | PRN

## 2012-09-07 MED ORDER — PROMETHAZINE HCL 25 MG/ML IJ SOLN: 6.2500 mg | INTRAMUSCULAR | Status: DC | PRN

## 2012-09-07 MED ORDER — PANTOPRAZOLE SODIUM 40 MG IV SOLR
40.0000 mg | Freq: Two times a day (BID) | INTRAVENOUS | Status: DC
  Administered 2012-09-07 – 2012-09-09 (×5): 40 mg via INTRAVENOUS
  Filled 2012-09-07 (×7): qty 40

## 2012-09-07 MED ORDER — SUCCINYLCHOLINE CHLORIDE 20 MG/ML IJ SOLN
INTRAMUSCULAR | Status: DC | PRN
  Administered 2012-09-07: 100 mg via INTRAVENOUS

## 2012-09-07 MED ORDER — ONDANSETRON HCL 4 MG/2ML IJ SOLN
INTRAMUSCULAR | Status: DC | PRN
  Administered 2012-09-07: 4 mg via INTRAVENOUS

## 2012-09-07 MED ORDER — PANTOPRAZOLE SODIUM 40 MG IV SOLR
40.0000 mg | INTRAVENOUS | Status: DC
  Filled 2012-09-07: qty 40

## 2012-09-07 MED ORDER — PROPOFOL 10 MG/ML IV BOLUS
INTRAVENOUS | Status: DC | PRN
  Administered 2012-09-07: 50 mg via INTRAVENOUS
  Administered 2012-09-07: 200 mg via INTRAVENOUS

## 2012-09-07 MED ORDER — CEFAZOLIN SODIUM 1-5 GM-% IV SOLN
1.0000 g | Freq: Four times a day (QID) | INTRAVENOUS | Status: AC
  Administered 2012-09-07 – 2012-09-08 (×3): 1 g via INTRAVENOUS
  Filled 2012-09-07 (×3): qty 50

## 2012-09-07 MED ORDER — LIDOCAINE HCL (CARDIAC) 20 MG/ML IV SOLN
INTRAVENOUS | Status: DC | PRN
  Administered 2012-09-07: 50 mg via INTRAVENOUS
  Administered 2012-09-07: 40 mg via INTRAVENOUS

## 2012-09-07 MED ORDER — MEPERIDINE HCL 50 MG/ML IJ SOLN: 6.2500 mg | INTRAMUSCULAR | Status: DC | PRN

## 2012-09-07 MED ORDER — NEOSTIGMINE METHYLSULFATE 1 MG/ML IJ SOLN
INTRAMUSCULAR | Status: DC | PRN
  Administered 2012-09-07: 4 mg via INTRAVENOUS

## 2012-09-07 MED ORDER — LACTATED RINGERS IV SOLN
INTRAVENOUS | Status: DC | PRN
  Administered 2012-09-07 (×3): via INTRAVENOUS

## 2012-09-07 MED ORDER — MUPIROCIN 2 % EX OINT
TOPICAL_OINTMENT | CUTANEOUS | Status: AC
  Filled 2012-09-07: qty 22

## 2012-09-07 MED ORDER — ACETAMINOPHEN 10 MG/ML IV SOLN
INTRAVENOUS | Status: AC
  Filled 2012-09-07: qty 100

## 2012-09-07 MED ORDER — SUFENTANIL CITRATE 50 MCG/ML IV SOLN
INTRAVENOUS | Status: DC | PRN
  Administered 2012-09-07 (×3): 10 ug via INTRAVENOUS
  Administered 2012-09-07: 20 ug via INTRAVENOUS
  Administered 2012-09-07: 15 ug via INTRAVENOUS
  Administered 2012-09-07 (×3): 10 ug via INTRAVENOUS
  Administered 2012-09-07: 5 ug via INTRAVENOUS

## 2012-09-07 MED ORDER — CEFAZOLIN SODIUM-DEXTROSE 2-3 GM-% IV SOLR
2.0000 g | INTRAVENOUS | Status: AC
  Administered 2012-09-07: 2 g via INTRAVENOUS

## 2012-09-07 MED ORDER — OXYCODONE HCL 5 MG/5ML PO SOLN: 5.0000 mg | Freq: Once | ORAL | Status: DC | PRN

## 2012-09-07 MED ORDER — PROMETHAZINE HCL 25 MG/ML IJ SOLN
INTRAMUSCULAR | Status: AC
  Filled 2012-09-07: qty 1

## 2012-09-07 MED ORDER — MORPHINE SULFATE 2 MG/ML IJ SOLN
2.0000 mg | INTRAMUSCULAR | Status: DC | PRN
  Administered 2012-09-07 (×2): 4 mg via INTRAVENOUS
  Administered 2012-09-07: 2 mg via INTRAVENOUS
  Administered 2012-09-07 (×2): 6 mg via INTRAVENOUS
  Administered 2012-09-08: 4 mg via INTRAVENOUS
  Administered 2012-09-09: 2 mg via INTRAVENOUS
  Administered 2012-09-10: 6 mg via INTRAVENOUS
  Filled 2012-09-07: qty 2
  Filled 2012-09-07 (×2): qty 3
  Filled 2012-09-07: qty 2
  Filled 2012-09-07: qty 1
  Filled 2012-09-07: qty 2
  Filled 2012-09-07: qty 1
  Filled 2012-09-07: qty 3

## 2012-09-07 SURGICAL SUPPLY — 57 items
APL SKNCLS STERI-STRIP NONHPOA (GAUZE/BANDAGES/DRESSINGS) ×2
APPLIER CLIP 5 13 M/L LIGAMAX5 (MISCELLANEOUS)
APPLIER CLIP ROT 10 11.4 M/L (STAPLE) ×3
APR CLP MED LRG 11.4X10 (STAPLE) ×2
APR CLP MED LRG 5 ANG JAW (MISCELLANEOUS)
BENZOIN TINCTURE PRP APPL 2/3 (GAUZE/BANDAGES/DRESSINGS) ×3 IMPLANT
BLADE HEX COATED 2.75 (ELECTRODE) ×2 IMPLANT
CANISTER SUCTION 2500CC (MISCELLANEOUS) ×3 IMPLANT
CANNULA ENDOPATH XCEL 11M (ENDOMECHANICALS) ×1 IMPLANT
CLIP APPLIE 5 13 M/L LIGAMAX5 (MISCELLANEOUS) IMPLANT
CLIP APPLIE ROT 10 11.4 M/L (STAPLE) IMPLANT
CLOTH BEACON ORANGE TIMEOUT ST (SAFETY) ×3 IMPLANT
DECANTER SPIKE VIAL GLASS SM (MISCELLANEOUS) ×3 IMPLANT
DEVICE SUT QUICK LOAD TK 5 (STAPLE) ×10 IMPLANT
DEVICE SUT TI-KNOT TK 5X26 (MISCELLANEOUS) ×7 IMPLANT
DEVICE SUTURE ENDOST 10MM (ENDOMECHANICALS) ×3 IMPLANT
DISSECTOR BLUNT TIP ENDO 5MM (MISCELLANEOUS) ×3 IMPLANT
DRAIN PENROSE 18X1/2 LTX STRL (DRAIN) ×3 IMPLANT
DRAPE LAPAROSCOPIC ABDOMINAL (DRAPES) ×3 IMPLANT
DRAPE UTILITY XL STRL (DRAPES) ×3 IMPLANT
DRSG TEGADERM 2-3/8X2-3/4 SM (GAUZE/BANDAGES/DRESSINGS) ×18 IMPLANT
ELECT REM PT RETURN 9FT ADLT (ELECTROSURGICAL) ×3
ELECTRODE REM PT RTRN 9FT ADLT (ELECTROSURGICAL) ×2 IMPLANT
ENDOSTITCH 0 SINGLE 48 (SUTURE) ×6 IMPLANT
FELT TEFLON 4 X1 (Mesh General) ×3 IMPLANT
FILTER SMOKE EVAC LAPAROSHD (FILTER) ×3 IMPLANT
GLOVE BIOGEL PI IND STRL 7.0 (GLOVE) ×2 IMPLANT
GLOVE BIOGEL PI INDICATOR 7.0 (GLOVE) ×2
GLOVE ECLIPSE 8.0 STRL XLNG CF (GLOVE) ×4 IMPLANT
GLOVE INDICATOR 8.0 STRL GRN (GLOVE) ×6 IMPLANT
GOWN STRL NON-REIN LRG LVL3 (GOWN DISPOSABLE) ×2 IMPLANT
GOWN STRL REIN XL XLG (GOWN DISPOSABLE) ×9 IMPLANT
GRASPER LAPSCPC 5X35 EPIX (ENDOMECHANICALS) ×4 IMPLANT
HAND ACTIVATED (MISCELLANEOUS) ×3 IMPLANT
KIT BASIN OR (CUSTOM PROCEDURE TRAY) ×3 IMPLANT
MARKER SKIN DUAL TIP RULER LAB (MISCELLANEOUS) ×1 IMPLANT
NS IRRIG 1000ML POUR BTL (IV SOLUTION) ×3 IMPLANT
PENCIL BUTTON HOLSTER BLD 10FT (ELECTRODE) IMPLANT
SCISSORS LAP 5X35 DISP (ENDOMECHANICALS) ×3 IMPLANT
SET IRRIG TUBING LAPAROSCOPIC (IRRIGATION / IRRIGATOR) ×3 IMPLANT
SLEEVE XCEL OPT CAN 5 100 (ENDOMECHANICALS) ×7 IMPLANT
SOLUTION ANTI FOG 6CC (MISCELLANEOUS) ×3 IMPLANT
SPONGE LAP 18X18 X RAY DECT (DISPOSABLE) IMPLANT
SUT MNCRL AB 4-0 PS2 18 (SUTURE) ×3 IMPLANT
SUT SURGIDAC NAB ES-9 0 48 120 (SUTURE) ×8 IMPLANT
TIP INNERVISION DETACH 40FR (MISCELLANEOUS) IMPLANT
TIP INNERVISION DETACH 50FR (MISCELLANEOUS) IMPLANT
TIP INNERVISION DETACH 56FR (MISCELLANEOUS) IMPLANT
TIPS INNERVISION DETACH 40FR (MISCELLANEOUS)
TOWEL OR 17X26 10 PK STRL BLUE (TOWEL DISPOSABLE) ×3 IMPLANT
TRAY FOLEY CATH 14FRSI W/METER (CATHETERS) ×3 IMPLANT
TRAY LAP CHOLE (CUSTOM PROCEDURE TRAY) ×3 IMPLANT
TROCAR BLADELESS OPT 5 100 (ENDOMECHANICALS) ×3 IMPLANT
TROCAR XCEL BLUNT TIP 100MML (ENDOMECHANICALS) ×2 IMPLANT
TROCAR XCEL NON-BLD 11X100MML (ENDOMECHANICALS) ×3 IMPLANT
TUBING CONNECTING 10 (TUBING) ×1 IMPLANT
TUBING FILTER THERMOFLATOR (ELECTROSURGICAL) ×3 IMPLANT

## 2012-09-07 NOTE — Progress Notes (Signed)
Awaiting call back from Dr. Abbey Chatters, patient c/o pain radiating into chest, informed this is typical for this surgical procedure. And patient is having indigestion.

## 2012-09-07 NOTE — Op Note (Signed)
Name:  Brooke Guerra MRN: [id number] Date of Surgery: 09/07/2012  Preop Diagnosis:  Achalasia, S/P Laparoscopic Heller myotomy  Postop Diagnosis:  Achalasia, S/P Laparoscopic Heller myotomy  Procedure:  Upper endoscopy  (Intraoperative)  Surgeon:  Ovidio Kin, M.D.  Anesthesia:  GET  Indications for procedure: Brooke Guerra is a 52 y.o. female whose primary care physician is Lonia Blood, MD  who has had achalasia. She has undergone a laparoscopic Heller myotomy by Dr. Avel Peace.   The endoscopy is to evaluate the adequacy of the Heller myotomy and make sure that there is no mucosal injury.  OPERATIVE NOTE:  The patient was intubated and asleep in room #1 at [facility]. Dr. Abbey Chatters was manning the laparoscope during the procedure.   A flexible Olympus adult endoscope was passed down the back of throat and into the esophagus without difficulty.  The patient had a dilated esophagus with a moderate amount of food debri in the esophagus.   I identified the beginning of the Heller myotomy starting about 5 to 6 cm above the esophagogastric junction. The esophagogastric junction was widely patent to about 1.5 cm diameter. The esophagogastric junction was at 40 cm.  With the stomach insufflated, Dr. Abbey Chatters flooded the upper abdomen with saline. There was no evidence of bubbling or leak. I then decompressed the stomach. It looked like the myotomy went down about 3 cm onto the stomach side beyond the esophago-gastric junction.   I then decompressed the stomach and withdrew the endoscope. The esophagus was dilated with food material in about half the esophagus.  Then Dr. Abbey Chatters completed the procedure. The patient tolerated the endoscopy well.  [Photos were taken at the EG junction.]   Dr. Abbey Chatters will dictate the remainder of the [facility] myotomy.  Ovidio Kin, MD, [facility] Surgery Pager: [phone] Office phone:  [phone]

## 2012-09-07 NOTE — Progress Notes (Signed)
Dr. Renold Don made aware of patient's Portable Chest X-ray results.

## 2012-09-07 NOTE — Anesthesia Postprocedure Evaluation (Signed)
Anesthesia Post Note  Patient: Brooke Guerra  Procedure(s) Performed: Procedure(s) (LRB): LAPAROSCOPIC HELLER MYOTOMY (N/A) Upper Endoscopy (N/A)  Anesthesia type: General  Patient location: PACU  Post pain: Pain level controlled  Post assessment: Post-op Vital signs reviewed  Last Vitals: BP 153/97  Pulse 83  Temp(Src) 36.7 C (Oral)  Resp 20  SpO2 95%  Post vital signs: Reviewed  Level of consciousness: sedated  Complications: Small amount of aspiration with induction as pt had gorged herself the night prior.  No apparent sequelae as pt SpO2 99% with [place] and lungs clear. Baseline CXR without evidence of aspiration. Will follow.

## 2012-09-07 NOTE — Anesthesia Preprocedure Evaluation (Addendum)
Anesthesia Evaluation  Patient identified by MRN, date of birth, ID band Patient awake    Reviewed: Allergy & Precautions, H&P , NPO status , Patient's Chart, lab work & pertinent test results, reviewed documented beta blocker date and time   Airway Mallampati: II TM Distance: >3 FB Neck ROM: Full    Dental  (+) Dental Advisory Given, Teeth Intact and Missing,    Pulmonary asthma , Current Smoker,  breath sounds clear to auscultation        Cardiovascular hypertension, Pt. on home beta blockers Rhythm:Regular Rate:Normal     Neuro/Psych negative neurological ROS  negative psych ROS   GI/Hepatic Neg liver ROS, GERD-  Medicated,  Endo/Other  negative endocrine ROS  Renal/GU negative Renal ROS     Musculoskeletal negative musculoskeletal ROS (+)   Abdominal (+) + obese,   Peds  Hematology negative hematology ROS (+)   Anesthesia Other Findings   Reproductive/Obstetrics negative OB ROS                          Anesthesia Physical Anesthesia Plan  ASA: II  Anesthesia Plan: General   Post-op Pain Management:    Induction: Intravenous  Airway Management Planned: Oral ETT  Additional Equipment:   Intra-op Plan:   Post-operative Plan: Extubation in OR  Informed Consent: I have reviewed the patients History and Physical, chart, labs and discussed the procedure including the risks, benefits and alternatives for the proposed anesthesia with the patient or authorized representative who has indicated his/her understanding and acceptance.   Dental advisory given  Plan Discussed with: CRNA  Anesthesia Plan Comments: (Prior to induction, CRNA stated she planned RSI. It was not communicated to me that the patient had "eaten all she could" last night. Pt was head up and RSI performed after proper pre O2. Prior to my being able to hold cricoid pressure, the patient refluxed mucous and food particles  through her nose and mouth. Aggressive suctioning and intubation proceeded without issue, though I had to release cricoid pressure in order for the ETT to pass through the glottis. ETT suctioned with minimal mucous removed, but pt likely aspirated. I was then alerted that the pt had engorged herself last night. I discussed with Dr. Abbey Chatters the preop diet, namely liquid for ~24-48hrs, to reduce the risk of aspiration on induction.)    Anesthesia Quick Evaluation

## 2012-09-07 NOTE — Transfer of Care (Signed)
Immediate Anesthesia Transfer of Care Note  Patient: Brooke Guerra  Procedure(s) Performed: Procedure(s): LAPAROSCOPIC HELLER MYOTOMY (N/A) Upper Endoscopy (N/A)  Patient Location: PACU  Anesthesia Type:General  Level of Consciousness: awake, alert , oriented and patient cooperative  Airway & Oxygen Therapy: Patient Spontanous Breathing and Patient connected to face mask oxygen  Post-op Assessment: Report given to PACU RN, Post -op Vital signs reviewed and stable and Patient moving all extremities X 4  Post vital signs: stable  Complications: No apparent anesthesia complications

## 2012-09-07 NOTE — Progress Notes (Signed)
Portable Chest X-ray done.

## 2012-09-07 NOTE — Progress Notes (Signed)
Spoke with Dr. Abbey Chatters, iformed him patient received morphine but not time again until for another 30 min, patient uncomfortable and c/o pain radiating to chest feels like she is having a heart attack, but informed patient this type of pain is typical for this surgery. MD agrees and states to keep [facility] about 30 degrees, informed him patient was in high-fowlers but stated her back hurts, and HOB put down slightly but patient informed that it is best to keep HOB up. Orders given for protonix and IV tylenol.

## 2012-09-07 NOTE — H&P (View-Only) (Signed)
Patient ID: Brooke Guerra, female   DOB: 12/17/1960, 52 y.o.   MRN: [id number]  Chief Complaint  Patient presents with  . New Evaluation    eval achalasia    HPI Brooke Guerra is a 52 y.o. female.   HPI  She is referred by Dr. Pyrtle for further evaluation of achalasia.  One year ago, she had an episode of severe heartburn after eating a hot dog. She said she had been doing well until relatively recently when she began having difficulty with swallowing and severe chest pains. She was regurgitating food. She was having some acid reflux. She ended being seen in the hospital and evaluated for chest pain. An upper GI demonstrated dilation of the esophagus with significant narrowing of the gastroesophageal junction. Upper endoscopy did not demonstrate any masses. Manometry demonstrated an elevated lower esophageal sphincter pressure and aperistalsis of the esophagus consistent with achalasia. She's been sent here to discuss surgical treatment.  Past Medical History  Diagnosis Date  . Asthma   . GERD (gastroesophageal reflux disease)     Past Surgical History  Procedure Laterality Date  . Abdominal hysterectomy    . Toe surgery      toe fx  . Esophagogastroduodenoscopy (egd) with esophageal dilation N/A 07/25/2012    Procedure: ESOPHAGOGASTRODUODENOSCOPY (EGD) WITH ESOPHAGEAL DILATION;  Surgeon: Jay M Pyrtle, MD;  Location: [facility];  Service: Gastroenterology;  Laterality: N/A;  . Esophageal manometry N/A 08/02/2012    Procedure: ESOPHAGEAL MANOMETRY (EM);  Surgeon: Jay M Pyrtle, MD;  Location: [facility];  Service: Gastroenterology;  Laterality: N/A;    Family History  Problem Relation Age of Onset  . Diabetes Sister   . Cancer Sister   . Heart attack Maternal Uncle 61    Social History History  Substance Use Topics  . Smoking status: Current Every Day Smoker -- 0.50 packs/day for 20 years    Types: Cigarettes  . Smokeless tobacco: Not on file  . Alcohol Use: Yes    No  Known Allergies  Current Outpatient Prescriptions  Medication Sig Dispense Refill  . metoprolol tartrate (LOPRESSOR) 12.5 mg TABS Take 0.5 tablets (12.5 mg total) by mouth 2 (two) times daily.  60 tablet  0  . oxyCODONE (OXY IR/ROXICODONE) 5 MG immediate release tablet Take 1 tablet (5 mg total) by mouth every 6 (six) hours as needed.  30 tablet  0  . pantoprazole (PROTONIX) 40 MG tablet Take 1 tablet (40 mg total) by mouth 2 (two) times daily.  60 tablet  0   No current facility-administered medications for this visit.    Review of Systems Review of Systems  Constitutional: Positive for appetite change (Decreased) and unexpected weight change (14 pound weight loss).  HENT: Negative.   Respiratory: Negative.   Cardiovascular: Positive for chest pain.  Gastrointestinal: Negative.   Genitourinary: Negative.   Musculoskeletal: Negative.   Neurological: Negative.   Hematological: Negative.     Blood pressure 128/92, pulse 95, temperature 96.1 F (35.6 C), temperature source Temporal, height 5' 4" (1.626 m), weight 186 lb 9.6 oz (84.641 kg), SpO2 96.00%.  Physical Exam Physical Exam  Constitutional: No distress.  Overweight female.  HENT:  Head: Normocephalic and atraumatic.  Eyes: EOM are normal. No scleral icterus.  Neck: Neck supple.  Cardiovascular: Normal rate and regular rhythm.   Pulmonary/Chest: Effort normal and breath sounds normal.  Abdominal: Soft. She exhibits no distension and no mass. There is no tenderness.  Musculoskeletal: She exhibits no edema.  Lymphadenopathy:      She has no cervical adenopathy.  Neurological: She is alert.  Skin: Skin is warm and dry.  Psychiatric: She has a normal mood and affect. Her behavior is normal.    Data Reviewed Dr. Pyrtle's note.  UGI. EGD. Manometry.  Assessment    Achalasia.  Symptoms are increasing. We discussed the pathology and treatment options of Botox injection, balloon dilatation, and laparoscopic Heller myotomy  with Dor fundoplication.     Plan    Will proceed with laparoscopic Heller myotomy with Dor fundoplication and upper endoscopy as this has the best long-term result.  We discussed that the goal of this operation would be to make her better but it would not make her normal.  The procedure and risks of the operation were explained. Risks include but are not limited to bleeding, infection, wound healing problems, anesthesia, esophageal perforation, injury to the stomach/colon/spleen/liver, failure of operation to improve the symptoms, reflux disease, dysphagia to some foods. We talked about importance of eating certain types of foods and certain behavioral changes with respect to diet.   She seems to understand all this and would like to proceed.       Jones Viviani J 08/23/2012, 2:14 PM    

## 2012-09-07 NOTE — Op Note (Signed)
Operative Note  Brooke Guerra female 52 y.o. 09/07/2012  PREOPERATIVE DX:  Achalasia  POSTOPERATIVE DX:  Same  PROCEDURE:  Laparoscopic Heller myotomy, Dorr fundoplication, upper endoscopy (by Dr. Ezzard Standing)         Surgeon: Adolph Pollack   Assistants: Dr. Ovidio Kin  Anesthesia: General endotracheal anesthesia  Indications:   This is a 52 year old female with classic findings for achalasia who is becoming increasingly symptomatic. She now presents for the above procedure.    Procedure Detail:  She was brought to the operating room placed supine on the operating table and a general anesthetic was administered. During the intubation, she aspirated some clear fluid. This was cleared mostly by way of suction through the endotracheal tube and she did not suffer any untoward effects from this during the operation. She remained well oxygenated. A Foley catheter was then inserted. The abdominal wall was widely sterilely prepped and draped.  Marcaine was infiltrated in the left upper quadrant subcostal region. She was placed in slight reverse Trendelenburg. A 5 mm incision was made in the left subcostal region. Using a 5 mm trocar and laparoscope access was gained to the peritoneal cavity and a pneumoperitoneum was created. Inspection underneath the trocar demonstrated no evidence of bleeding or organ injury. A 5 mm trocar was placed in the right upper quadrant laterally. An 11 mm trocar was placed just lateral to the umbilicus and the left side. An 11 mm trocar was placed in the epigastric area. A 5 mm trocar is placed in the left upper quadrant laterally. A 5 mm incision was made in the subxiphoid area. The liver retractor was placed through this incision and used to retract the left lobe of the liver anteriorly exposing the hiatus area.  A thin gastrohepatic ligament was divided up to the level of the right crus. The phrenoesophageal attachments were then divided with the harmonic scalpel  exposing the left crus. Using blunt dissection a retroesophageal window was created. A Penrose drain was placed through the retroesophageal window overlying for downward retraction of the gastroesophageal junction.  Using selective electrocautery and the avulsion technique the myotomy was started proximal to the gastroesophageal junction by first dividing the longitudinal fibers. Underlying circular fibers were then divided. This was carried proximally for 6 cm up onto the esophagus. The myotomy was 180.  The myotomy  was then carried across the gastroesophageal junction  dividing the longitudinal and circular fibers. This was carried  3 cm onto the stomach. It also was 180. There is no obvious injury to the underlying mucosa or obvious leak.  Upper endoscopy was then performed by Dr. Ezzard Standing. This demonstrated that the gastroesophageal junction was wide open and allow for easy passage of the endoscope. There was no evidence of mucosal leak. There were food particles found in the esophagus and these were evacuated as much as possible. The esophagus was significantly dilated.  The myotomy appeared to be adequate.  Next, The fundus of the stomach was mobilized by dividing short gastric vessels. A Dor anterior fundoplication was then performed.  The fundus was initially sewn to the left crus and esophageal muscle that had been divided using size 0 nonabsorbable sutures. This was done 3 times on the left side with only the proximal suture involving the crus. The fundus was then sewn to the divided muscle and the right crus using three size 0 nonabsorbable sutures. The anterior aspect of the fundus was then sewn to the anterior aspect of the hiatus with a 0  nonabsorbable suture. A fourth nonabsorbable suture was used to approximate the day of fundus to some fatty tissue inferiorly completely covering the myotomy.  A 4 quadrant inspection was performed. There is no evidence of gastric injury esophageal injury or  other organ injury. There is no evidence of bleeding. The liver retractor was removed. The trocars were all removed and the pneumoperitoneum was released. The skin incisions were then closed 4-0 Monocryl subcuticular stitches. Steri-Strips and sterile dressings were applied.  She tolerated the procedure well. There no apparent complications during the surgical procedure. She was taken to the recovery room in satisfactory condition where a portable chest x-ray will be performed given the small amount of aspiration during induction of anesthesia.  Estimated Blood Loss:  200 mL         Drains: none  Blood Given: none          Specimens: none        Complications:  * No complications entered in OR log *         Disposition: PACU - hemodynamically stable.         Condition: stable

## 2012-09-07 NOTE — Interval H&P Note (Signed)
History and Physical Interval Note:  09/07/2012 7:22 AM  Brooke Guerra  has presented today for surgery, with the diagnosis of achalasic  The various methods of treatment have been discussed with the patient and family. After consideration of risks, benefits and other options for treatment, the patient has consented to  Procedure(s): LAPAROSCOPIC HELLER MYOTOMY (N/A) Upper Endoscopy (N/A) as a surgical intervention .  The patient's history has been reviewed, patient examined, no change in status, stable for surgery.  I have reviewed the patient's chart and labs.  Questions were answered to the patient's satisfaction.     Cara Aguino Shela Commons

## 2012-09-08 ENCOUNTER — Observation Stay (HOSPITAL_COMMUNITY): Payer: BC Managed Care – PPO

## 2012-09-08 ENCOUNTER — Encounter (HOSPITAL_COMMUNITY): Payer: Self-pay | Admitting: General Surgery

## 2012-09-08 MED ORDER — MENTHOL 3 MG MT LOZG: 1.0000 | LOZENGE | OROMUCOSAL | Status: DC | PRN

## 2012-09-08 MED ORDER — PROMETHAZINE HCL 25 MG/ML IJ SOLN
12.5000 mg | INTRAMUSCULAR | Status: DC | PRN
  Administered 2012-09-08 – 2012-09-10 (×2): 12.5 mg via INTRAVENOUS
  Filled 2012-09-08 (×2): qty 1

## 2012-09-08 MED ORDER — IOHEXOL 300 MG/ML  SOLN
150.0000 mL | Freq: Once | INTRAMUSCULAR | Status: AC | PRN
  Administered 2012-09-08: 20 mL via ORAL

## 2012-09-08 NOTE — Progress Notes (Signed)
1 Day Post-Op  Subjective: She c/o of epigastric and lower substernal pain.  Also having some nausea. Walking in the hall.  Objective: Vital signs in last 24 hours: Temp:  [98.1 F (36.7 C)-99.5 F (37.5 C)] 98.6 F (37 C) (05/07 0600) Pulse Rate:  [59-90] 59 (05/07 0600) Resp:  [16-25] 18 (05/07 0600) BP: (123-180)/(65-100) 157/87 mmHg (05/07 0600) SpO2:  [95 %-100 %] 100 % (05/07 0600) Weight:  [186 lb (84.369 kg)] 186 lb (84.369 kg) (05/06 1814)    Intake/Output from previous day: 05/06 0701 - 05/07 0700 In: 4440 [I.V.:4440] Out: 5325 [Urine:5250; Blood:75] Intake/Output this shift:    PE: General- In NAD  Lungs-clear Abdomen-soft, dressings dry, tender around incisions  Lab Results:   Recent Labs  09/06/12 0910  WBC 9.1  HGB 12.0  HCT 38.3  PLT 307   BMET  Recent Labs  09/06/12 0910  NA 138  K 3.6  CL 101  CO2 31  GLUCOSE 93  BUN 10  CREATININE 0.89  CALCIUM 9.1   PT/INR  Recent Labs  09/06/12 0910  LABPROT 13.1  INR 1.00   Comprehensive Metabolic Panel:    Component Value Date/Time   NA 138 09/06/2012 0910   K 3.6 09/06/2012 0910   CL 101 09/06/2012 0910   CO2 31 09/06/2012 0910   BUN 10 09/06/2012 0910   CREATININE 0.89 09/06/2012 0910   GLUCOSE 93 09/06/2012 0910   CALCIUM 9.1 09/06/2012 0910   AST 10 09/06/2012 0910   ALT 6 09/06/2012 0910   ALKPHOS 88 09/06/2012 0910   BILITOT 0.4 09/06/2012 0910   PROT 7.2 09/06/2012 0910   ALBUMIN 3.7 09/06/2012 0910     Studies/Results: Dg Chest Port 1 View  09/07/2012  *RADIOLOGY REPORT*  Clinical Data: 52 year old female as post Heller myotomy and fundoplication for achalasia.  Possible aspiration during intubation.  PORTABLE CHEST - 1 VIEW  Comparison: 07/23/2012  Findings: The cardiomediastinal silhouette is unremarkable. Bilateral lower lung atelectasis noted, right greater than left. There is no evidence of focal airspace disease, pulmonary edema, suspicious pulmonary nodule/mass, pleural effusion, or  pneumothorax. No acute bony abnormalities are identified.  IMPRESSION: Bilateral lower lung atelectasis without evidence of airspace disease.   Original Report Authenticated By: Harmon Pier, M.D.     Anti-infectives: Anti-infectives   Start     Dose/Rate Route Frequency Ordered Stop   09/07/12 1400  ceFAZolin (ANCEF) IVPB 1 g/50 mL premix     1 g 100 mL/hr over 30 Minutes Intravenous Every 6 hours 09/07/12 1215 09/08/12 0201   09/07/12 0606  ceFAZolin (ANCEF) IVPB 2 g/50 mL premix     2 g 100 mL/hr over 30 Minutes Intravenous On call to O.R. 09/07/12 0606 09/07/12 0730      Assessment Principal Problem:   Achalasia s/p Heller myotomy and Dor fundoplication 09/07/12-having some nausea not well controlled with Zofran.    LOS: 1 day   Plan: Add Phenergan for nausea.  Check UGI.   Garlon Tuggle J 09/08/2012

## 2012-09-08 NOTE — Progress Notes (Signed)
INITIAL NUTRITION ASSESSMENT  Pt meets criteria for severe MALNUTRITION in the context of chronic illness as evidenced by <50-75% estimated energy intake with 6.5% weight loss in the past 2 months per pt report.  DOCUMENTATION CODES Per approved criteria  -Severe malnutrition in the context of chronic illness   INTERVENTION: - Used teach back method to educate pt on post-op diet for Heller myotomy. Discussed small, frequent, low fiber, puree meals and encouraged pt to drink nutritional supplements in between meals for weight maintenance/gain. Handouts provided with RD contact information. - Diet advancement per MD - Will continue to monitor   NUTRITION DIAGNOSIS: Inadequate oral intake related to inability to eat as evidenced by NPO.   Goal: Advance diet as tolerated to dysphagia 1, thin, diet (puree).   Monitor:  Weights, labs, diet advancement, nausea  Reason for Assessment: Nutrition risk   52 y.o. female  Admitting Dx: Achalasia  ASSESSMENT: Pt admitted for Heller myotomy which was completed yesterday. Pt reports 13 pound unintended weight in the past 2 months r/t achalasia and vomiting after meals. Pt c/o nausea today, notified RN.   Height: Ht Readings from Last 1 Encounters:  09/07/12 5\' 4"  (1.626 m)    Weight: Wt Readings from Last 1 Encounters:  09/07/12 186 lb (84.369 kg)    Ideal Body Weight: 120 lb  % Ideal Body Weight: 155  Wt Readings from Last 10 Encounters:  09/07/12 186 lb (84.369 kg)  09/07/12 186 lb (84.369 kg)  09/06/12 186 lb 9.6 oz (84.641 kg)  08/23/12 186 lb 9.6 oz (84.641 kg)  07/24/12 191 lb 8 oz (86.864 kg)  07/24/12 191 lb 8 oz (86.864 kg)    Usual Body Weight: 199 lb per pt  % Usual Body Weight: 93  BMI:  Body mass index is 31.91 kg/(m^2). Class I obesity   Estimated Nutritional Needs: Kcal: 1550-1650 Protein: 65-75g Fluid: 1.5-1.6L/day  Skin: Abdominal incision   Diet Order: NPO  EDUCATION NEEDS: -Education needs  addressed - discussed post-op diet for Heller myotomy    Intake/Output Summary (Last 24 hours) at 09/08/12 1125 Last data filed at 09/08/12 1000  Gross per 24 hour  Intake 2136.67 ml  Output   5500 ml  Net -3363.33 ml    Last BM: PTA  Labs:   Recent Labs Lab 09/06/12 0910  NA 138  K 3.6  CL 101  CO2 31  BUN 10  CREATININE 0.89  CALCIUM 9.1  GLUCOSE 93    CBG (last 3)  No results found for this basename: GLUCAP,  in the last 72 hours  Scheduled Meds: . metoprolol  2.5 mg Intravenous Q12H  . pantoprazole (PROTONIX) IV  40 mg Intravenous Q12H    Continuous Infusions: . dextrose 5% lactated ringers with KCl 20 mEq/L 100 mL/hr at 09/08/12 0602    Past Medical History  Diagnosis Date  . Asthma   . GERD (gastroesophageal reflux disease)   . Hypertension     Past Surgical History  Procedure Laterality Date  . Abdominal hysterectomy    . Toe surgery      toe fx  . Esophagogastroduodenoscopy (egd) with esophageal dilation N/A 07/25/2012    Procedure: ESOPHAGOGASTRODUODENOSCOPY (EGD) WITH ESOPHAGEAL DILATION;  Surgeon: Beverley Fiedler, MD;  Location: [facility] ENDOSCOPY;  Service: Gastroenterology;  Laterality: N/A;  . Esophageal manometry N/A 08/02/2012    Procedure: ESOPHAGEAL MANOMETRY (EM);  Surgeon: Beverley Fiedler, MD;  Location: [facility];  Service: Gastroenterology;  Laterality: N/A;  Girard Koontz Baron MS, RD, LDN [phone] Pager [phone] After Hours Pager  

## 2012-09-08 NOTE — Progress Notes (Signed)
UGI shows no esophageal leak and no passage of contrast into the stomach.  The latter may be due to postop edema and/or pressure of the anterior fundoplication on the GE junction.  This will likely take time to improve.  Will check an abdominal x-ray tomorrow to see if contrast passes.  Keep NPO for now.  Will give Cepacol lozenges for her sore throat which is her biggest complaint.

## 2012-09-09 ENCOUNTER — Inpatient Hospital Stay (HOSPITAL_COMMUNITY): Payer: BC Managed Care – PPO

## 2012-09-09 MED ORDER — HYDROCODONE-ACETAMINOPHEN 5-325 MG PO TABS
1.0000 | ORAL_TABLET | ORAL | Status: DC | PRN
  Administered 2012-09-09 (×2): 1 via ORAL
  Administered 2012-09-09 – 2012-09-10 (×3): 2 via ORAL
  Filled 2012-09-09 (×2): qty 2
  Filled 2012-09-09 (×2): qty 1
  Filled 2012-09-09: qty 2

## 2012-09-09 NOTE — Progress Notes (Signed)
2 Days Post-Op  Subjective: No nausea.  Still with incisional tenderness but lower substernal tenderness is better.  Coughing some.  Objective: Vital signs in last 24 hours: Temp:  [98.3 F (36.8 C)-99.1 F (37.3 C)] 98.6 F (37 C) (05/08 0702) Pulse Rate:  [57-70] 66 (05/08 0702) Resp:  [18] 18 (05/08 0702) BP: (137-154)/(61-90) 141/76 mmHg (05/08 0702) SpO2:  [99 %-100 %] 99 % (05/08 0702) Last BM Date: 09/07/12  Intake/Output from previous day: 05/07 0701 - 05/08 0700 In: 2396.7 [I.V.:2396.7] Out: 2400 [Urine:2400] Intake/Output this shift:    PE: General- In NAD  Lungs-clear Abdomen-soft, dressings dry, tender around incisions, few bowel sounds  Lab Results:   Recent Labs  09/06/12 0910  WBC 9.1  HGB 12.0  HCT 38.3  PLT 307   BMET  Recent Labs  09/06/12 0910  NA 138  K 3.6  CL 101  CO2 31  GLUCOSE 93  BUN 10  CREATININE 0.89  CALCIUM 9.1   PT/INR  Recent Labs  09/06/12 0910  LABPROT 13.1  INR 1.00   Comprehensive Metabolic Panel:    Component Value Date/Time   NA 138 09/06/2012 0910   K 3.6 09/06/2012 0910   CL 101 09/06/2012 0910   CO2 31 09/06/2012 0910   BUN 10 09/06/2012 0910   CREATININE 0.89 09/06/2012 0910   GLUCOSE 93 09/06/2012 0910   CALCIUM 9.1 09/06/2012 0910   AST 10 09/06/2012 0910   ALT 6 09/06/2012 0910   ALKPHOS 88 09/06/2012 0910   BILITOT 0.4 09/06/2012 0910   PROT 7.2 09/06/2012 0910   ALBUMIN 3.7 09/06/2012 0910     Studies/Results: Dg Chest Port 1 View  09/07/2012  *RADIOLOGY REPORT*  Clinical Data: 52 year old female as post Heller myotomy and fundoplication for achalasia.  Possible aspiration during intubation.  PORTABLE CHEST - 1 VIEW  Comparison: 07/23/2012  Findings: The cardiomediastinal silhouette is unremarkable. Bilateral lower lung atelectasis noted, right greater than left. There is no evidence of focal airspace disease, pulmonary edema, suspicious pulmonary nodule/mass, pleural effusion, or pneumothorax. No acute bony  abnormalities are identified.  IMPRESSION: Bilateral lower lung atelectasis without evidence of airspace disease.   Original Report Authenticated By: Harmon Pier, M.D.    Dg Kayleen Memos W/water Sol Cm  09/08/2012  *RADIOLOGY REPORT*  Clinical Data: Myotomy and fundoplication 09/07/2012.  Now with nausea.  ESOPHOGRAM/BARIUM SWALLOW  Technique:  Single contrast examination was performed using Omnipaque water soluble contrast.  Fluoroscopy time:  1-minute-31-second  Comparison:  Upper GI 07/24/2012  Findings:  The patient swallowed approximately three mouthfuls of Omnipaque contrast.  The  esophagus is mildly dilated with decreased peristalsis.  There is a complete obstruction of the gastroesophageal junction.  The table was placed in the upright position for approximately 5 minutes.  Intermittent fluoroscopy revealed no contrast in the stomach.  There is a smooth tapered narrowing of the distal esophagus at the surgical site.  No leak is identified.  Preliminary image of the abdomen reveals no dilated bowel. Surgical clips in the region of the stomach.  IMPRESSION: Complete obstruction of the GE junction.  No leak is identified.  I discussed the findings by telephone with Dr. Abbey Chatters   Original Report Authenticated By: Janeece Riggers, M.D.     Anti-infectives: Anti-infectives   Start     Dose/Rate Route Frequency Ordered Stop   09/07/12 1400  ceFAZolin (ANCEF) IVPB 1 g/50 mL premix     1 g 100 mL/hr over 30 Minutes Intravenous Every 6 hours  09/07/12 1215 09/08/12 0201   09/07/12 0606  ceFAZolin (ANCEF) IVPB 2 g/50 mL premix     2 g 100 mL/hr over 30 Minutes Intravenous On call to O.R. 09/07/12 0606 09/07/12 0730      Assessment Principal Problem:   Achalasia s/p Heller myotomy and Dor fundoplication 09/07/12-No passage of contrast into stomach on UGI yesterday-may be secondary to edema or pressure on GE junction from anterior fundoplication.    LOS: 2 days   Plan: Check plan abdominal x-ray to see where  contrast is.  Try clear liquids.   Holger Sokolowski J 09/09/2012

## 2012-09-09 NOTE — Progress Notes (Signed)
Tolerating clear liquids well. Swallowing is better than preop.  Will advance to full liquids.  We had a detailed discuss about her postop diet.

## 2012-09-10 ENCOUNTER — Other Ambulatory Visit (INDEPENDENT_AMBULATORY_CARE_PROVIDER_SITE_OTHER): Payer: Self-pay | Admitting: General Surgery

## 2012-09-10 MED ORDER — PANTOPRAZOLE SODIUM 40 MG PO TBEC: 40.0000 mg | DELAYED_RELEASE_TABLET | Freq: Every day | ORAL | Status: DC

## 2012-09-10 MED ORDER — HYDROCODONE-ACETAMINOPHEN 5-325 MG PO TABS: 1.0000 | ORAL_TABLET | ORAL | Status: DC | PRN

## 2012-09-10 MED ORDER — METOPROLOL TARTRATE 25 MG PO TABS: 12.5000 mg | ORAL_TABLET | Freq: Two times a day (BID) | ORAL | Status: DC

## 2012-09-10 MED ORDER — ONDANSETRON HCL 4 MG PO TABS: 4.0000 mg | ORAL_TABLET | ORAL | Status: DC | PRN

## 2012-09-10 NOTE — Progress Notes (Signed)
3 Days Post-Op  Subjective: Tolerating full liquids with no dysphagia   Objective: Vital signs in last 24 hours: Temp:  [97.9 F (36.6 C)-98.4 F (36.9 C)] 97.9 F (36.6 C) (05/09 0533) Pulse Rate:  [66-73] 70 (05/09 0533) Resp:  [18] 18 (05/09 0533) BP: (116-139)/(75-77) 120/75 mmHg (05/09 0533) SpO2:  [96 %-97 %] 97 % (05/09 0533) Last BM Date: 09/07/12  Intake/Output from previous day: 05/08 0701 - 05/09 0700 In: 1625 [I.V.:1625] Out: 1300 [Urine:1300] Intake/Output this shift:    PE: General- In NAD Abdomen-soft, incisions are clean and intact  Lab Results:  No results found for this basename: WBC, HGB, HCT, PLT,  in the last 72 hours BMET No results found for this basename: NA, K, CL, CO2, GLUCOSE, BUN, CREATININE, CALCIUM,  in the last 72 hours PT/INR No results found for this basename: LABPROT, INR,  in the last 72 hours Comprehensive Metabolic Panel:    Component Value Date/Time   NA 138 09/06/2012 0910   K 3.6 09/06/2012 0910   CL 101 09/06/2012 0910   CO2 31 09/06/2012 0910   BUN 10 09/06/2012 0910   CREATININE 0.89 09/06/2012 0910   GLUCOSE 93 09/06/2012 0910   CALCIUM 9.1 09/06/2012 0910   AST 10 09/06/2012 0910   ALT 6 09/06/2012 0910   ALKPHOS 88 09/06/2012 0910   BILITOT 0.4 09/06/2012 0910   PROT 7.2 09/06/2012 0910   ALBUMIN 3.7 09/06/2012 0910     Studies/Results: Dg Abd 1 View  09/09/2012  *RADIOLOGY REPORT*  Clinical Data: Recent esophagram, contrast retention in the esophagus.  Assess for progression of oral contrast  ABDOMEN - 1 VIEW  Comparison: 09/08/2012  Findings: Postop changes in the left upper quadrant.  Contrast has migrated into the proximal right colon, hepatic flexure and proximal transverse colon.  Contrast also is within the normal appearing appendix.  No small bowel obstruction pattern.  No osseous abnormality.  IMPRESSION: Migration of the contrast to the proximal colon   Original Report Authenticated By: Judie Petit. Miles Costain, M.D.    Varney Biles Kayleen Memos W/water Sol  Cm  09/08/2012  *RADIOLOGY REPORT*  Clinical Data: Myotomy and fundoplication 09/07/2012.  Now with nausea.  ESOPHOGRAM/BARIUM SWALLOW  Technique:  Single contrast examination was performed using Omnipaque water soluble contrast.  Fluoroscopy time:  1-minute-31-second  Comparison:  Upper GI 07/24/2012  Findings:  The patient swallowed approximately three mouthfuls of Omnipaque contrast.  The  esophagus is mildly dilated with decreased peristalsis.  There is a complete obstruction of the gastroesophageal junction.  The table was placed in the upright position for approximately 5 minutes.  Intermittent fluoroscopy revealed no contrast in the stomach.  There is a smooth tapered narrowing of the distal esophagus at the surgical site.  No leak is identified.  Preliminary image of the abdomen reveals no dilated bowel. Surgical clips in the region of the stomach.  IMPRESSION: Complete obstruction of the GE junction.  No leak is identified.  I discussed the findings by telephone with Dr. Abbey Chatters   Original Report Authenticated By: Janeece Riggers, M.D.     Anti-infectives: Anti-infectives   Start     Dose/Rate Route Frequency Ordered Stop   09/07/12 1400  ceFAZolin (ANCEF) IVPB 1 g/50 mL premix     1 g 100 mL/hr over 30 Minutes Intravenous Every 6 hours 09/07/12 1215 09/08/12 0201   09/07/12 0606  ceFAZolin (ANCEF) IVPB 2 g/50 mL premix     2 g 100 mL/hr over 30 Minutes Intravenous On call  to O.R. 09/07/12 0606 09/07/12 0730      Assessment Principal Problem:   Achalasia s/p Heller myotomy and Dor fundoplication 09/07/12-doing well   LOS: 3 days   Plan:  Discharge. Instructions given.   Brooke Guerra J 09/10/2012

## 2012-09-17 NOTE — Discharge Summary (Signed)
Physician Discharge Summary  Patient ID: Brooke Guerra MRN: [id number] DOB/AGE: 50-Apr-1962 50 y.o.  Admit date: 09/07/2012 Discharge date: 09/10/2012  Admission Diagnoses:  Achalasia  Discharge Diagnoses:  Principal Problem:   Achalasia s/p Heller myotomy and Dor fundoplication 09/07/12   Discharged Condition: good  Hospital Course: She underwent the above procedure and tolerated this well. On her first postoperative day she underwent an upper GI study which demonstrated no leak but the esophagus did not drain. She was kept n.p.o. On that day. A plain abdominal x-ray on postop day #2 demonstrated the contrast to be all the way through to the right colon. She was started on clear liquids and advance to full liquids. She tolerated this well without dysphagia and stated her swelling was significantly better than preop. She was ill be discharged on her third postoperative day. She was given specific dietary and activity instructions and will followup in the office in 2-3 weeks.  Consults: None  Significant Diagnostic Studies: Upper GI  Treatments: surgery: Laparoscopic Heller myotomy and Dor fundoplication  Discharge Exam: Blood pressure 120/75, pulse 70, temperature 97.9 F (36.6 C), temperature source Axillary, resp. rate 18, height 5\' 4"  (1.626 m), weight 186 lb (84.369 kg), SpO2 97.00%.   Disposition: 01-Home or Self Care   Future Appointments Provider Department Dept Phone   09/22/2012 9:10 AM Adolph Pollack, MD [facility], [place] [phone]       Medication List    STOP taking these medications       oxyCODONE 5 MG immediate release tablet  Commonly known as:  Oxy IR/ROXICODONE      TAKE these medications       HYDROcodone-acetaminophen 5-325 MG per tablet  Commonly known as:  NORCO/VICODIN  Take 1-2 tablets by mouth every 4 (four) hours as needed.     metoprolol tartrate 25 MG tablet  Commonly known as:  LOPRESSOR  Take 0.5 tablets (12.5 mg total) by  mouth 2 (two) times daily.     pantoprazole 40 MG tablet  Commonly known as:  PROTONIX  Take 1 tablet (40 mg total) by mouth daily.           Follow-up Information   Follow up with Adolph Pollack, MD.   Contact information:   [address] [place] [address] [phone]       Signed: Adolph Pollack 09/17/2012, 10:38 AM

## 2012-09-22 ENCOUNTER — Ambulatory Visit (INDEPENDENT_AMBULATORY_CARE_PROVIDER_SITE_OTHER): Payer: BC Managed Care – PPO | Admitting: General Surgery

## 2012-09-22 ENCOUNTER — Encounter (INDEPENDENT_AMBULATORY_CARE_PROVIDER_SITE_OTHER): Payer: Self-pay | Admitting: General Surgery

## 2012-09-22 VITALS — BP 108/78 | HR 82 | Temp 99.5°F | Resp 16 | Ht 64.0 in | Wt 176.4 lb

## 2012-09-22 DIAGNOSIS — Z9889 Other specified postprocedural states: Secondary | ICD-10-CM

## 2012-09-22 NOTE — Progress Notes (Signed)
Procedure:  Laparoscopic Heller myotomy and Dor fundoplication  Date:  09/07/2012  Pathology:  na  History:  She presents for her first postoperative visit. She is tolerating a ladder one diet well without dysphagia. Her heartburn which she had preoperatively is better. She does get full fast.  Exam: General- Is in NAD. Abdomen-soft, incisions are clean and intact.  Assessment:  Doing well after Heller myotomy.  Plan:  Advanced to level II diet for 2 weeks and if tolerated level III diet. Return visit one month.

## 2012-09-22 NOTE — Patient Instructions (Addendum)
Advanced to level II diet for 2 weeks then level III diet.  Avoid bread products. Continue light activities.

## 2012-10-25 ENCOUNTER — Encounter (INDEPENDENT_AMBULATORY_CARE_PROVIDER_SITE_OTHER): Payer: Self-pay | Admitting: General Surgery

## 2012-10-25 ENCOUNTER — Ambulatory Visit (INDEPENDENT_AMBULATORY_CARE_PROVIDER_SITE_OTHER): Payer: BC Managed Care – PPO | Admitting: General Surgery

## 2012-10-25 VITALS — BP 132/90 | HR 78 | Temp 97.4°F | Resp 17 | Ht 63.0 in | Wt 173.0 lb

## 2012-10-25 DIAGNOSIS — Z9889 Other specified postprocedural states: Secondary | ICD-10-CM

## 2012-10-25 NOTE — Progress Notes (Signed)
Procedure:  Laparoscopic Heller myotomy and Dor fundoplication  Date:  09/07/2012  Pathology:  na  History:  She presents for her second postoperative visit. She is tolerating a full diet without difficulty.  No significant heartburn. Exam: General- Is in NAD. Abdomen-soft, incisions are clean and intact.  Assessment:  Continues to do well after Heller myotomy.  Plan: Diet and activity as tolerated. We discussed avoiding overeating and weight gain. We discussed standing upright for 60-90 minutes after meals and chewing food well. Return visit 4 months.

## 2012-10-25 NOTE — Patient Instructions (Signed)
Diet as tolerated. Do not overeat. Do not gain weight. Chew your food slowly and well. Stay upright for 60-90 minutes after eating.  Activities as tolerated.

## 2013-01-12 ENCOUNTER — Encounter (INDEPENDENT_AMBULATORY_CARE_PROVIDER_SITE_OTHER): Payer: Self-pay | Admitting: General Surgery

## 2013-02-01 ENCOUNTER — Encounter (INDEPENDENT_AMBULATORY_CARE_PROVIDER_SITE_OTHER): Payer: Self-pay

## 2013-02-25 ENCOUNTER — Ambulatory Visit (INDEPENDENT_AMBULATORY_CARE_PROVIDER_SITE_OTHER): Payer: BC Managed Care – PPO | Admitting: General Surgery

## 2013-03-02 ENCOUNTER — Ambulatory Visit (INDEPENDENT_AMBULATORY_CARE_PROVIDER_SITE_OTHER): Payer: BC Managed Care – PPO | Admitting: General Surgery

## 2013-03-08 ENCOUNTER — Encounter (INDEPENDENT_AMBULATORY_CARE_PROVIDER_SITE_OTHER): Payer: Self-pay | Admitting: General Surgery

## 2013-04-06 ENCOUNTER — Encounter (INDEPENDENT_AMBULATORY_CARE_PROVIDER_SITE_OTHER): Payer: Self-pay | Admitting: General Surgery

## 2013-04-06 ENCOUNTER — Encounter (INDEPENDENT_AMBULATORY_CARE_PROVIDER_SITE_OTHER): Payer: Self-pay

## 2013-04-06 ENCOUNTER — Ambulatory Visit (INDEPENDENT_AMBULATORY_CARE_PROVIDER_SITE_OTHER): Payer: Self-pay | Admitting: General Surgery

## 2013-04-06 VITALS — BP 128/84 | HR 83 | Temp 98.8°F | Resp 18 | Ht 64.0 in | Wt 181.0 lb

## 2013-04-06 DIAGNOSIS — K220 Achalasia of cardia: Secondary | ICD-10-CM

## 2013-04-06 NOTE — Progress Notes (Signed)
Subjective:     Patient ID: Brooke Guerra, female   DOB: 1960-09-16, 52 y.o.   MRN: [id number]  HPI  She is here for long-term followup after her laparoscopic heller myotomy for achalasia. She states she's having some trouble with swallowing. This is particularly with things like hotdogs where she cannot chew them well. Things that she is able to chew well she has not had much difficulty with.   Review of Systems     Objective:   Physical Exam Gen.-she looks well and is in no acute distress.  Abdomen-soft, incisions are well-healed.    Assessment:     Achalasia status post laparoscopic Heller myotomy-is having problems swallowing chunks of food otherwise I think she is doing well.     Plan:     I had a discussion with her about her diet. I told her to avoid a lot of bread products as well as foods that she cannot chew up well. I explained to her that the goal of the operation was to make her swallowing better but this could recur. She understands this. I've asked her to call back if she is unable to swallow solid foods. At that time we'll likely need to do an upper GI study and possible repeat manometry.

## 2013-04-06 NOTE — Patient Instructions (Signed)
Avoid foods that cannot be chewed well. Be careful with bread products. Be careful with rice. Be careful with raw fruits and vegetables. Call if you can no longer swallow solid food.

## 2013-05-17 ENCOUNTER — Encounter (HOSPITAL_COMMUNITY): Payer: Self-pay | Admitting: Emergency Medicine

## 2013-05-17 DIAGNOSIS — M549 Dorsalgia, unspecified: Secondary | ICD-10-CM | POA: Insufficient documentation

## 2013-05-17 DIAGNOSIS — J111 Influenza due to unidentified influenza virus with other respiratory manifestations: Secondary | ICD-10-CM | POA: Insufficient documentation

## 2013-05-17 DIAGNOSIS — R5381 Other malaise: Secondary | ICD-10-CM | POA: Insufficient documentation

## 2013-05-17 DIAGNOSIS — R5383 Other fatigue: Secondary | ICD-10-CM

## 2013-05-17 DIAGNOSIS — R51 Headache: Secondary | ICD-10-CM | POA: Insufficient documentation

## 2013-05-17 DIAGNOSIS — R110 Nausea: Secondary | ICD-10-CM | POA: Insufficient documentation

## 2013-05-17 DIAGNOSIS — F17200 Nicotine dependence, unspecified, uncomplicated: Secondary | ICD-10-CM | POA: Insufficient documentation

## 2013-05-17 DIAGNOSIS — J45909 Unspecified asthma, uncomplicated: Secondary | ICD-10-CM | POA: Insufficient documentation

## 2013-05-17 DIAGNOSIS — Z8719 Personal history of other diseases of the digestive system: Secondary | ICD-10-CM | POA: Insufficient documentation

## 2013-05-17 DIAGNOSIS — I10 Essential (primary) hypertension: Secondary | ICD-10-CM | POA: Insufficient documentation

## 2013-05-17 NOTE — ED Notes (Signed)
Called x's 3 without response 

## 2013-05-17 NOTE — ED Notes (Signed)
Pt requesting EKG

## 2013-05-17 NOTE — ED Notes (Signed)
Pt c/o generalized body aches onset this am.  Non productive cough.  Also c/o headache.  Nausea with no vomiting and diarrhea x's 1

## 2013-05-18 ENCOUNTER — Emergency Department (HOSPITAL_COMMUNITY): Payer: Self-pay

## 2013-05-18 ENCOUNTER — Emergency Department (HOSPITAL_COMMUNITY)
Admission: EM | Admit: 2013-05-18 | Discharge: 2013-05-18 | Disposition: A | Discharge status: Home / Self Care | Payer: Self-pay | Attending: Emergency Medicine | Admitting: Emergency Medicine

## 2013-05-18 DIAGNOSIS — R69 Illness, unspecified: Secondary | ICD-10-CM

## 2013-05-18 DIAGNOSIS — J111 Influenza due to unidentified influenza virus with other respiratory manifestations: Secondary | ICD-10-CM

## 2013-05-18 DIAGNOSIS — R51 Headache: Secondary | ICD-10-CM

## 2013-05-18 DIAGNOSIS — R519 Headache, unspecified: Secondary | ICD-10-CM

## 2013-05-18 MED ORDER — HYDROCODONE-HOMATROPINE 5-1.5 MG/5ML PO SYRP: 5.0000 mL | ORAL_SOLUTION | Freq: Four times a day (QID) | ORAL | Status: DC | PRN

## 2013-05-18 MED ORDER — MOMETASONE FUROATE 50 MCG/ACT NA SUSP: 2.0000 | Freq: Every day | NASAL | Status: DC

## 2013-05-18 MED ORDER — IBUPROFEN 400 MG PO TABS
600.0000 mg | ORAL_TABLET | Freq: Once | ORAL | Status: AC
  Administered 2013-05-18: 600 mg via ORAL
  Filled 2013-05-18 (×2): qty 1

## 2013-05-18 MED ORDER — OXYMETAZOLINE HCL 0.05 % NA SOLN: 1.0000 | Freq: Two times a day (BID) | NASAL | Status: DC

## 2013-05-18 MED ORDER — IBUPROFEN 600 MG PO TABS: 600.0000 mg | ORAL_TABLET | Freq: Four times a day (QID) | ORAL | Status: DC | PRN

## 2013-05-18 MED ORDER — OSELTAMIVIR PHOSPHATE 75 MG PO CAPS: 75.0000 mg | ORAL_CAPSULE | Freq: Two times a day (BID) | ORAL | Status: DC

## 2013-05-18 MED ORDER — OSELTAMIVIR PHOSPHATE 75 MG PO CAPS
75.0000 mg | ORAL_CAPSULE | Freq: Once | ORAL | Status: AC
  Administered 2013-05-18: 75 mg via ORAL
  Filled 2013-05-18: qty 1

## 2013-05-18 NOTE — Progress Notes (Signed)
                 Dear _________[name]__________________:  Dennis Bast have been approved to have your discharge prescriptions filled through our [facility] (Medication Assistance Through [facility]) program. This program allows for a one-time (no refills) 34-day supply of selected medications for a low copay amount.  The copay is $3.00 per prescription. For instance, if you have one prescription, you will pay $3.00; for two prescriptions, you pay $6.00; for three prescriptions, you pay $9.00; and so on.  Only certain pharmacies are participating in this program with [facility]. You will need to select one of the pharmacies from the attached list and take your prescriptions, this letter, and your photo ID to one of the participating pharmacies.   We are excited that you are able to use the [facility] program to get your medications. These prescriptions must be filled within 7 days of hospital discharge or they will no longer be valid for the [facility] program. Should you have any problems with your prescriptions please contact your case management team member at [phone].  Thank you,   [place]

## 2013-05-18 NOTE — ED Provider Notes (Signed)
CSN: [id number]     Arrival date & time 05/17/13  2224 History   First MD Initiated Contact with Patient 05/18/13 0150     Chief Complaint  Patient presents with  . Generalized Body Aches   (Consider location/radiation/quality/duration/timing/severity/associated sxs/prior Treatment) HPI Patient presents with 24 hours of diffuse body aches, nonproductive cough, frontal headache and nausea. She has no neck stiffness or pain. She has no sore throat. She has no shortness of breath. She has no vomiting or diarrhea. She denies any urinary symptoms. She denies any abdominal pain. Patient states she's had sick contacts diagnosed recently with flu. Past Medical History  Diagnosis Date  . Asthma   . GERD (gastroesophageal reflux disease)   . Hypertension    Past Surgical History  Procedure Laterality Date  . Abdominal hysterectomy    . Toe surgery      toe fx  . Esophagogastroduodenoscopy (egd) with esophageal dilation N/A 07/25/2012    Procedure: ESOPHAGOGASTRODUODENOSCOPY (EGD) WITH ESOPHAGEAL DILATION;  Surgeon: Jerene Bears, MD;  Location: [place];  Service: Gastroenterology;  Laterality: N/A;  . Esophageal manometry N/A 08/02/2012    Procedure: ESOPHAGEAL MANOMETRY (EM);  Surgeon: Jerene Bears, MD;  Location: [facility];  Service: Gastroenterology;  Laterality: N/A;  Bubba Hales myotomy N/A 09/07/2012    Procedure: LAPAROSCOPIC HELLER MYOTOMY;  Surgeon: Odis Hollingshead, MD;  Location: [facility];  Service: General;  Laterality: N/A;  . Esophagogastroduodenoscopy endoscopy N/A 09/07/2012    Procedure: Upper Endoscopy;  Surgeon: Odis Hollingshead, MD;  Location: [facility];  Service: General;  Laterality: N/A;   Family History  Problem Relation Age of Onset  . Diabetes Sister   . Cancer Sister     unknown  . Heart attack Maternal Uncle 61   History  Substance Use Topics  . Smoking status: Current Every Day Smoker -- 0.25 packs/day for 20 years    Types: Cigarettes  . Smokeless tobacco: Not  on file  . Alcohol Use: Yes     Comment: occassional   OB History   Grav Para Term Preterm Abortions TAB SAB Ect Mult Living                 Review of Systems  Constitutional: Positive for fever, chills and fatigue.  HENT: Positive for congestion and sinus pressure. Negative for sore throat.   Eyes: Negative for visual disturbance.  Respiratory: Positive for cough. Negative for shortness of breath and wheezing.   Cardiovascular: Negative for chest pain, palpitations and leg swelling.  Gastrointestinal: Positive for nausea. Negative for vomiting, abdominal pain and diarrhea.  Genitourinary: Negative for dysuria, vaginal bleeding, vaginal discharge, vaginal pain and pelvic pain.  Musculoskeletal: Positive for back pain and myalgias. Negative for neck pain and neck stiffness.  Skin: Negative for rash and wound.  Neurological: Positive for headaches. Negative for dizziness, syncope, weakness and numbness.  All other systems reviewed and are negative.    Allergies  Review of patient's allergies indicates no known allergies.  Home Medications  No current outpatient prescriptions on file. BP 133/74  Pulse 76  Temp(Src) 99.9 F (37.7 C) (Oral)  Resp 20  Wt 180 lb 3.2 oz (81.738 kg)  SpO2 100% Physical Exam  Nursing note and vitals reviewed. Constitutional: She is oriented to person, place, and time. She appears well-developed and well-nourished. No distress.  HENT:  Head: Normocephalic and atraumatic.  Mouth/Throat: Oropharynx is clear and moist. No oropharyngeal exudate.  Diffuse sinus tenderness to percussion. Patient has bilateral  nasal mucosal edema.  Eyes: EOM are normal. Pupils are equal, round, and reactive to light.  Neck: Normal range of motion. Neck supple.  No nuchal rigidity or meningismus  Cardiovascular: Normal rate and regular rhythm.   Pulmonary/Chest: Effort normal and breath sounds normal. No respiratory distress. She has no wheezes. She has no rales. She  exhibits no tenderness.  Abdominal: Soft. Bowel sounds are normal. She exhibits no distension and no mass. There is no tenderness. There is no rebound and no guarding.  Musculoskeletal: Normal range of motion. She exhibits no edema and no tenderness.  Diffuse muscular tenderness to palpation especially in the thoracic back. She has no midline thoracic or lumbar tenderness.  Neurological: She is alert and oriented to person, place, and time.  Moves all extremities without deficit. Sensation is grossly intact.  Skin: Skin is warm and dry. No rash noted. No erythema.  Psychiatric: She has a normal mood and affect. Her behavior is normal.    ED Course  Procedures (including critical care time) Labs Review Labs Reviewed - No data to display Imaging Review Dg Chest 2 View  05/18/2013   CLINICAL DATA:  Cough and fever for 2 day  EXAM: CHEST  2 VIEW  COMPARISON:  09/07/2012  FINDINGS: Streaky opacities in the bilateral lower lungs. No evidence of consolidation or edema. No effusion or pneumothorax. Normal heart size.  IMPRESSION: Patchy bilateral atelectasis.   Electronically Signed   By: Jorje Guild M.D.   On: 05/18/2013 03:03    EKG Interpretation   None       MDM  Based on history and exam patient's symptoms are consistent with influenza-like illness and with sinus disease. Given is less than 48 hours since onset of symptoms will start on Tamiflu. I don't believe antibiotics are necessary at this point. Patient's vital to followup with her primary Dr. return precautions have been given and patient has voiced understanding.    [name], MD 05/18/13 [phone]

## 2013-05-18 NOTE — Discharge Instructions (Signed)
Sinus Headache A sinus headache is when your sinuses become clogged or swollen. Sinus headaches can range from mild to severe.  CAUSES A sinus headache can have different causes, such as:  Colds.  Sinus infections.  Allergies. SYMPTOMS  Symptoms of a sinus headache may vary and can include:  Headache.  Pain or pressure in the face.  Congested or runny nose.  Fever.  Inability to smell.  Pain in upper teeth. Weather changes can make symptoms worse. TREATMENT  The treatment of a sinus headache depends on the cause.  Sinus pain caused by a sinus infection may be treated with antibiotic medicine.  Sinus pain caused by allergies may be helped by allergy medicines (antihistamines) and medicated nasal sprays.  Sinus pain caused by congestion may be helped by flushing the nose and sinuses with saline solution. HOME CARE INSTRUCTIONS   If antibiotics are prescribed, take them as directed. Finish them even if you start to feel better.  Only take over-the-counter or prescription medicines for pain, discomfort, or fever as directed by your caregiver.  If you have congestion, use a nasal spray to help reduce pressure. SEEK IMMEDIATE MEDICAL CARE IF:  You have a fever.  You have headaches more than once a week.  You have sensitivity to light or sound.  You have repeated nausea and vomiting.  You have vision problems.  You have sudden, severe pain in your face or head.  You have a seizure.  You are confused.  Your sinus headaches do not get better after treatment. Many people think they have a sinus headache when they actually have migraines or tension headaches. MAKE SURE YOU:   Understand these instructions.  Will watch your condition.  Will get help right away if you are not doing well or get worse. Document Released: 05/29/2004 Document Revised: 07/14/2011 Document Reviewed: 07/20/2010 [facility] Patient Information 2014 [place].  Influenza,  Adult Influenza ("the flu") is a viral infection of the respiratory tract. It occurs more often in winter months because people spend more time in close contact with one another. Influenza can make you feel very sick. Influenza easily spreads from person to person (contagious). CAUSES  Influenza is caused by a virus that infects the respiratory tract. You can catch the virus by breathing in droplets from an infected person's cough or sneeze. You can also catch the virus by touching something that was recently contaminated with the virus and then touching your mouth, nose, or eyes. SYMPTOMS  Symptoms typically last 4 to 10 days and may include:  Fever.  Chills.  Headache, body aches, and muscle aches.  Sore throat.  Chest discomfort and cough.  Poor appetite.  Weakness or feeling tired.  Dizziness.  Nausea or vomiting. DIAGNOSIS  Diagnosis of influenza is often made based on your history and a physical exam. A nose or throat swab test can be done to confirm the diagnosis. RISKS AND COMPLICATIONS You may be at risk for a more severe case of influenza if you smoke cigarettes, have diabetes, have chronic heart disease (such as heart failure) or lung disease (such as asthma), or if you have a weakened immune system. Elderly people and pregnant women are also at risk for more serious infections. The most common complication of influenza is a lung infection (pneumonia). Sometimes, this complication can require emergency medical care and may be life-threatening. PREVENTION  An annual influenza vaccination (flu shot) is the best way to avoid getting influenza. An annual flu shot is now routinely  recommended for all adults in the [place] TREATMENT  In mild cases, influenza goes away on its own. Treatment is directed at relieving symptoms. For more severe cases, your caregiver may prescribe antiviral medicines to shorten the sickness. Antibiotic medicines are not effective, because the infection is  caused by a virus, not by bacteria. HOME CARE INSTRUCTIONS  Only take over-the-counter or prescription medicines for pain, discomfort, or fever as directed by your caregiver.  Use a cool mist humidifier to make breathing easier.  Get plenty of rest until your temperature returns to normal. This usually takes 3 to 4 days.  Drink enough fluids to keep your urine clear or pale yellow.  Cover your mouth and nose when coughing or sneezing, and wash your hands well to avoid spreading the virus.  Stay home from work or school until your fever has been gone for at least 1 full day. SEEK MEDICAL CARE IF:   You have chest pain or a deep cough that worsens or produces more mucus.  You have nausea, vomiting, or diarrhea. SEEK IMMEDIATE MEDICAL CARE IF:   You have difficulty breathing, shortness of breath, or your skin or nails turn bluish.  You have severe neck pain or stiffness.  You have a severe headache, facial pain, or earache.  You have a worsening or recurring fever.  You have nausea or vomiting that cannot be controlled. MAKE SURE YOU:  Understand these instructions.  Will watch your condition.  Will get help right away if you are not doing well or get worse. Document Released: 04/18/2000 Document Revised: 10/21/2011 Document Reviewed: 07/21/2011 [facility] Patient Information 2014 [place], [place].

## 2013-05-18 NOTE — ED Notes (Signed)
Patient transported to CT 

## 2013-05-18 NOTE — Progress Notes (Signed)
   CARE MANAGEMENT ED NOTE 05/18/2013  Patient:  Brooke Guerra, Brooke Guerra   Account Number:  [id number]  Date Initiated:  05/18/2013  Documentation initiated by:  Jackelyn Poling  Subjective/Objective Assessment:   53 yr old [place] resident with only 1 ED visit in last 6 months pcp mohammad garba  05/07/13 [facility] ED c/o generalized body aches onset this am  Non productive cough.  Also c/o headache.  Nausea with no vomiting and diarrhea x's 1     Subjective/Objective Assessment Detail:   left message for Cm requesting medication assistance for d/c medications Tamiflu, afrin nasal spray, nasonex nasal spray, hydcodan and motrin Pt returned Cm call to confirm pharmacy of preference is [facility] on [address] (non participating [employer]) but second choice is [facility] [place] (participating pharmacy)     Action/Plan:   CM received pt's voice message Cm returned a call, left voice message briefly reviewing Available med assist program with co pay that does not assist with pain meds Requested a return call to verify her pharmacy of choice   Action/Plan Detail:   CM reviewed with pt when pt reached in the match program in detail & confirmed a participating match pharmacy for match letter to be faxed Reviewed medications for d/c list of medications that would not be covered (narcotic or OTC)   Anticipated DC Date:       Status Recommendation to Physician:   Result of Recommendation:    Other ED [place]  Other  Medication Assistance  [place] Program  Outpatient Services - Pt will follow up    Choice offered to / List presented to:            Status of service:  Completed, signed off  ED Comments:  CM reviewed EPIC notes and chart review information CM spoke with the pt about [facility] MATCH program ($3 co pay for each Rx through [facility] program, does not include refills, 7 day expiration of [place] letter and choice of  pharmacies)  ED Comments Detail:  Pt voiced understanding of [place] program, why narcotic/OTC meds not covered and co pay cost Pt provided with [facility] super center pharmacy information [address] [place], [place], [place] [address]  [phone] and encouraged to call prior to going to pick up meds with her original prescriptions  Pt is eligible for [facility] MATCH program (unable to find pt listed in PDMI per cardholder name inquiry) PDMI information entered. Faxed to confirmed ([employer]) fax number [phone] with fax confirmation received 05/18/13 at 1631

## 2013-05-19 NOTE — Progress Notes (Signed)
[facility] ED CM received voice messages x 2 informing her that [place] [facility] did not have "information on me"  CM spoke with Tisha at [phone] to confirm match letter is available for the pt at the pharmacy Reviewed with Judie Petit that the letter will cover her [place] Judie Petit confirms other medications otc and they can offer other cough medication OTC for pt CM returned a call to Pt and re reviewed this with her and verified the correct address she is to go to go get [facility] assistance and encouraged her to speak to [place] Pt voiced understanding

## 2013-12-08 ENCOUNTER — Encounter (HOSPITAL_COMMUNITY): Payer: Self-pay | Admitting: Emergency Medicine

## 2013-12-08 ENCOUNTER — Emergency Department (HOSPITAL_COMMUNITY)
Admission: EM | Admit: 2013-12-08 | Discharge: 2013-12-08 | Disposition: A | Discharge status: Home / Self Care | Payer: Medicaid Other | Attending: Emergency Medicine | Admitting: Emergency Medicine

## 2013-12-08 ENCOUNTER — Emergency Department (HOSPITAL_COMMUNITY): Payer: Medicaid Other

## 2013-12-08 DIAGNOSIS — IMO0002 Reserved for concepts with insufficient information to code with codable children: Secondary | ICD-10-CM | POA: Diagnosis not present

## 2013-12-08 DIAGNOSIS — Z79899 Other long term (current) drug therapy: Secondary | ICD-10-CM | POA: Insufficient documentation

## 2013-12-08 DIAGNOSIS — M545 Low back pain, unspecified: Secondary | ICD-10-CM

## 2013-12-08 DIAGNOSIS — J45909 Unspecified asthma, uncomplicated: Secondary | ICD-10-CM | POA: Insufficient documentation

## 2013-12-08 DIAGNOSIS — Z8719 Personal history of other diseases of the digestive system: Secondary | ICD-10-CM | POA: Diagnosis not present

## 2013-12-08 DIAGNOSIS — S60229A Contusion of unspecified hand, initial encounter: Secondary | ICD-10-CM | POA: Diagnosis not present

## 2013-12-08 DIAGNOSIS — I10 Essential (primary) hypertension: Secondary | ICD-10-CM | POA: Diagnosis not present

## 2013-12-08 DIAGNOSIS — Y9389 Activity, other specified: Secondary | ICD-10-CM | POA: Diagnosis not present

## 2013-12-08 DIAGNOSIS — S6990XA Unspecified injury of unspecified wrist, hand and finger(s), initial encounter: Secondary | ICD-10-CM | POA: Diagnosis present

## 2013-12-08 DIAGNOSIS — Y92410 Unspecified street and highway as the place of occurrence of the external cause: Secondary | ICD-10-CM | POA: Diagnosis not present

## 2013-12-08 DIAGNOSIS — S60221A Contusion of right hand, initial encounter: Secondary | ICD-10-CM

## 2013-12-08 MED ORDER — CYCLOBENZAPRINE HCL 10 MG PO TABS: 10.0000 mg | ORAL_TABLET | Freq: Two times a day (BID) | ORAL | Status: DC | PRN

## 2013-12-08 MED ORDER — TRAMADOL HCL 50 MG PO TABS
50.0000 mg | ORAL_TABLET | Freq: Once | ORAL | Status: AC
  Administered 2013-12-08: 50 mg via ORAL
  Filled 2013-12-08: qty 1

## 2013-12-08 MED ORDER — NAPROXEN 375 MG PO TABS: 375.0000 mg | ORAL_TABLET | Freq: Two times a day (BID) | ORAL | Status: DC

## 2013-12-08 MED ORDER — HYDROCODONE-ACETAMINOPHEN 5-325 MG PO TABS: 1.0000 | ORAL_TABLET | ORAL | Status: DC | PRN
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
## 2013-12-08 NOTE — ED Notes (Signed)
Right thumb swollen

## 2013-12-08 NOTE — Discharge Instructions (Signed)
Back Pain, Adult °Low back pain is very common. About 1 in 5 people have back pain. The cause of low back pain is rarely dangerous. The pain often gets better over time. About half of people with a sudden onset of back pain feel better in just 2 weeks. About 8 in 10 people feel better by 6 weeks.  °CAUSES °Some common causes of back pain include: °· Strain of the muscles or ligaments supporting the spine. °· Wear and tear (degeneration) of the spinal discs. °· Arthritis. °· Direct injury to the back. °DIAGNOSIS °Most of the time, the direct cause of low back pain is not known. However, back pain can be treated effectively even when the exact cause of the pain is unknown. Answering your caregiver's questions about your overall health and symptoms is one of the most accurate ways to make sure the cause of your pain is not dangerous. If your caregiver needs more information, he or she may order lab work or imaging tests (X-rays or MRIs). However, even if imaging tests show changes in your back, this usually does not require surgery. °HOME CARE INSTRUCTIONS °For many people, back pain returns. Since low back pain is rarely dangerous, it is often a condition that people can learn to manage on their own.  °· Remain active. It is stressful on the back to sit or stand in one place. Do not sit, drive, or stand in one place for more than 30 minutes at a time. Take short walks on level surfaces as soon as pain allows. Try to increase the length of time you walk each day. °· Do not stay in bed. Resting more than 1 or 2 days can delay your recovery. °· Do not avoid exercise or work. Your body is made to move. It is not dangerous to be active, even though your back may hurt. Your back will likely heal faster if you return to being active before your pain is gone. °· Pay attention to your body when you  bend and lift. Many people have less discomfort when lifting if they bend their knees, keep the load close to their bodies, and  avoid twisting. Often, the most comfortable positions are those that put less stress on your recovering back. °· Find a comfortable position to sleep. Use a firm mattress and lie on your side with your knees slightly bent. If you lie on your back, put a pillow under your knees. °· Only take over-the-counter or prescription medicines as directed by your caregiver. Over-the-counter medicines to reduce pain and inflammation are often the most helpful. Your caregiver may prescribe muscle relaxant drugs. These medicines help dull your pain so you can more quickly return to your normal activities and healthy exercise. °· Put ice on the injured area. °¨ Put ice in a plastic bag. °¨ Place a towel between your skin and the bag. °¨ Leave the ice on for 15-20 minutes, 03-04 times a day for the first 2 to 3 days. After that, ice and heat may be alternated to reduce pain and spasms. °· Ask your caregiver about trying back exercises and gentle massage. This may be of some benefit. °· Avoid feeling anxious or stressed. Stress increases muscle tension and can worsen back pain. It is important to recognize when you are anxious or stressed and learn ways to manage it. Exercise is a great option. °SEEK MEDICAL CARE IF: °· You have pain that is not relieved with rest or medicine. °· You have pain that does not improve in 1 week. °· You have new symptoms. °· You are generally not feeling well. °SEEK   IMMEDIATE MEDICAL CARE IF:   You have pain that radiates from your back into your legs.  You develop new bowel or bladder control problems.  You have unusual weakness or numbness in your arms or legs.  You develop nausea or vomiting.  You develop abdominal pain.  You feel faint. Document Released: 04/21/2005 Document Revised: 10/21/2011 Document Reviewed: 08/23/2013 [facility] Patient Information 2015 [place], [place]. This information is not intended to replace advice given to you by your health care provider. Make sure you  discuss any questions you have with your health care provider.  Hand Contusion A hand contusion is a deep bruise on your hand area. Contusions are the result of an injury that caused bleeding under the skin. The contusion may turn blue, purple, or yellow. Minor injuries will give you a painless contusion, but more severe contusions may stay painful and swollen for a few weeks. CAUSES  A contusion is usually caused by a blow, trauma, or direct force to an area of the body. SYMPTOMS   Swelling and redness of the injured area.  Discoloration of the injured area.  Tenderness and soreness of the injured area.  Pain. DIAGNOSIS  The diagnosis can be made by taking a history and performing a physical exam. An X-ray, CT scan, or MRI may be needed to determine if there were any associated injuries, such as broken bones (fractures). TREATMENT  Often, the best treatment for a hand contusion is resting, elevating, icing, and applying cold compresses to the injured area. Over-the-counter medicines may also be recommended for pain control. HOME CARE INSTRUCTIONS   Put ice on the injured area.  Put ice in a plastic bag.  Place a towel between your skin and the bag.  Leave the ice on for 15-20 minutes, 03-04 times a day.  Only take over-the-counter or prescription medicines as directed by your caregiver. Your caregiver may recommend avoiding anti-inflammatory medicines (aspirin, ibuprofen, and naproxen) for 48 hours because these medicines may increase bruising.  If told, use an elastic wrap as directed. This can help reduce swelling. You may remove the wrap for sleeping, showering, and bathing. If your fingers become numb, cold, or blue, take the wrap off and reapply it more loosely.  Elevate your hand with pillows to reduce swelling.  Avoid overusing your hand if it is painful. SEEK IMMEDIATE MEDICAL CARE IF:   You have increased redness, swelling, or pain in your hand.  Your swelling or  pain is not relieved with medicines.  You have loss of feeling in your hand or are unable to move your fingers.  Your hand turns cold or blue.  You have pain when you move your fingers.  Your hand becomes warm to the touch.  Your contusion does not improve in 2 days. MAKE SURE YOU:   Understand these instructions.  Will watch your condition.  Will get help right away if you are not doing well or get worse. Document Released: 10/11/2001 Document Revised: 01/14/2012 Document Reviewed: 10/13/2011 [facility] Patient Information 2015 [place], [place]. This information is not intended to replace advice given to you by your health care provider. Make sure you discuss any questions you have with your health care provider.  Motor Vehicle Collision It is common to have multiple bruises and sore muscles after a motor vehicle collision (MVC). These tend to feel worse for the first 24 hours. You may have the most stiffness and soreness over the first several hours. You may also feel worse when you wake  up the first morning after your collision. After this point, you will usually begin to improve with each day. The speed of improvement often depends on the severity of the collision, the number of injuries, and the location and nature of these injuries. HOME CARE INSTRUCTIONS  Put ice on the injured area.  Put ice in a plastic bag.  Place a towel between your skin and the bag.  Leave the ice on for 15-20 minutes, 3-4 times a day, or as directed by your health care provider.  Drink enough fluids to keep your urine clear or pale yellow. Do not drink alcohol.  Take a warm shower or bath once or twice a day. This will increase blood flow to sore muscles.  You may return to activities as directed by your caregiver. Be careful when lifting, as this may aggravate neck or back pain.  Only take over-the-counter or prescription medicines for pain, discomfort, or fever as directed by your caregiver. Do not  use aspirin. This may increase bruising and bleeding. SEEK IMMEDIATE MEDICAL CARE IF:  You have numbness, tingling, or weakness in the arms or legs.  You develop severe headaches not relieved with medicine.  You have severe neck pain, especially tenderness in the middle of the back of your neck.  You have changes in bowel or bladder control.  There is increasing pain in any area of the body.  You have shortness of breath, light-headedness, dizziness, or fainting.  You have chest pain.  You feel sick to your stomach (nauseous), throw up (vomit), or sweat.  You have increasing abdominal discomfort.  There is blood in your urine, stool, or vomit.  You have pain in your shoulder (shoulder strap areas).  You feel your symptoms are getting worse. MAKE SURE YOU:  Understand these instructions.  Will watch your condition.  Will get help right away if you are not doing well or get worse. Document Released: 04/21/2005 Document Revised: 09/05/2013 Document Reviewed: 09/18/2010 [facility] Patient Information 2015 [place], [place]. This information is not intended to replace advice given to you by your health care provider. Make sure you discuss any questions you have with your health care provider.

## 2013-12-09 NOTE — ED Provider Notes (Signed)
Medical screening examination/treatment/procedure(s) were performed by non-physician practitioner and as supervising physician I was immediately available for consultation/collaboration.   EKG Interpretation None        [name], MD 12/09/13 [phone]

## 2013-12-15 ENCOUNTER — Telehealth: Payer: Self-pay | Admitting: *Deleted

## 2013-12-15 NOTE — Telephone Encounter (Signed)
Called patient to let her know that  Per Dr. Erlinda Hong she needs a follow up with her primary not Neurology. If patient come in please relay this information.

## 2014-02-11 ENCOUNTER — Encounter (HOSPITAL_COMMUNITY): Payer: Self-pay | Admitting: Emergency Medicine

## 2014-02-11 DIAGNOSIS — Z720 Tobacco use: Secondary | ICD-10-CM | POA: Insufficient documentation

## 2014-02-11 DIAGNOSIS — M25511 Pain in right shoulder: Secondary | ICD-10-CM | POA: Diagnosis not present

## 2014-02-11 DIAGNOSIS — J45909 Unspecified asthma, uncomplicated: Secondary | ICD-10-CM | POA: Insufficient documentation

## 2014-02-11 DIAGNOSIS — I10 Essential (primary) hypertension: Secondary | ICD-10-CM | POA: Insufficient documentation

## 2014-02-11 DIAGNOSIS — Z8719 Personal history of other diseases of the digestive system: Secondary | ICD-10-CM | POA: Diagnosis not present

## 2014-02-11 DIAGNOSIS — M549 Dorsalgia, unspecified: Secondary | ICD-10-CM | POA: Diagnosis not present

## 2014-02-11 DIAGNOSIS — M79603 Pain in arm, unspecified: Secondary | ICD-10-CM | POA: Diagnosis present

## 2014-02-11 NOTE — ED Notes (Signed)
Pt. Reports right arm pain , right leg pain and lower back pain onset this morning , denies injury/ambulatory. Alert and oriented/respirations unlabored.

## 2014-02-12 ENCOUNTER — Emergency Department (HOSPITAL_COMMUNITY): Payer: Medicaid Other

## 2014-02-12 ENCOUNTER — Emergency Department (HOSPITAL_COMMUNITY)
Admission: EM | Admit: 2014-02-12 | Discharge: 2014-02-12 | Disposition: A | Discharge status: Home / Self Care | Payer: Medicaid Other | Attending: Emergency Medicine | Admitting: Emergency Medicine

## 2014-02-12 ENCOUNTER — Encounter (HOSPITAL_COMMUNITY): Payer: Self-pay | Admitting: Radiology

## 2014-02-12 DIAGNOSIS — M25511 Pain in right shoulder: Secondary | ICD-10-CM

## 2014-02-12 LAB — CBC WITH DIFFERENTIAL/PLATELET
BASOS ABS: 0 10*3/uL (ref 0.0–0.1)
Basophils Relative: 0 % (ref 0–1)
EOS ABS: 0.2 10*3/uL (ref 0.0–0.7)
EOS PCT: 1 % (ref 0–5)
HCT: 35.4 % — ABNORMAL LOW (ref 36.0–46.0)
Hemoglobin: 11.1 g/dL — ABNORMAL LOW (ref 12.0–15.0)
Lymphocytes Relative: 27 % (ref 12–46)
Lymphs Abs: 3.6 10*3/uL (ref 0.7–4.0)
MCH: 27.4 pg (ref 26.0–34.0)
MCHC: 31.4 g/dL (ref 30.0–36.0)
MCV: 87.4 fL (ref 78.0–100.0)
Monocytes Absolute: 0.7 10*3/uL (ref 0.1–1.0)
Monocytes Relative: 5 % (ref 3–12)
Neutro Abs: 8.7 10*3/uL — ABNORMAL HIGH (ref 1.7–7.7)
Neutrophils Relative %: 67 % (ref 43–77)
PLATELETS: 235 10*3/uL (ref 150–400)
RBC: 4.05 MIL/uL (ref 3.87–5.11)
RDW: 14 % (ref 11.5–15.5)
WBC: 13.1 10*3/uL — ABNORMAL HIGH (ref 4.0–10.5)

## 2014-02-12 LAB — BASIC METABOLIC PANEL
ANION GAP: 8 (ref 5–15)
BUN: 8 mg/dL (ref 6–23)
CALCIUM: 9 mg/dL (ref 8.4–10.5)
CO2: 29 mEq/L (ref 19–32)
Chloride: 102 mEq/L (ref 96–112)
Creatinine, Ser: 0.81 mg/dL (ref 0.50–1.10)
GFR calc Af Amer: >90 mL/min (ref >90)
GFR, EST NON AFRICAN AMERICAN: 83 mL/min — AB (ref >90)
GLUCOSE: 106 mg/dL — AB (ref 70–99)
Potassium: 3.4 mEq/L — ABNORMAL LOW (ref 3.7–5.3)
SODIUM: 139 meq/L (ref 137–147)

## 2014-02-12 LAB — CK: Total CK: 68 U/L (ref 7–177)

## 2014-02-12 LAB — PROTIME-INR
INR: 1.09 (ref 0.00–1.49)
PROTHROMBIN TIME: 14.1 s (ref 11.6–15.2)

## 2014-02-12 MED ORDER — IBUPROFEN 800 MG PO TABS: 800.0000 mg | ORAL_TABLET | Freq: Three times a day (TID) | ORAL | Status: DC

## 2014-02-12 MED ORDER — IOHEXOL 350 MG/ML SOLN
100.0000 mL | Freq: Once | INTRAVENOUS | Status: AC | PRN
  Administered 2014-02-12: 100 mL via INTRAVENOUS

## 2014-02-12 MED ORDER — HYDROCODONE-ACETAMINOPHEN 5-325 MG PO TABS: 1.0000 | ORAL_TABLET | Freq: Four times a day (QID) | ORAL | Status: DC | PRN

## 2014-02-12 MED ORDER — MORPHINE SULFATE 4 MG/ML IJ SOLN
6.0000 mg | Freq: Once | INTRAMUSCULAR | Status: AC
  Administered 2014-02-12: 6 mg via INTRAVENOUS
  Filled 2014-02-12: qty 2

## 2014-02-12 MED ORDER — SODIUM CHLORIDE 0.9 % IV BOLUS (SEPSIS)
1000.0000 mL | Freq: Once | INTRAVENOUS | Status: AC
  Administered 2014-02-12: 1000 mL via INTRAVENOUS

## 2014-02-12 MED ORDER — IBUPROFEN 800 MG PO TABS: 800.0000 mg | ORAL_TABLET | Freq: Three times a day (TID) | ORAL | Status: DC | PRN

## 2014-02-12 MED ORDER — HYDROCODONE-ACETAMINOPHEN 5-325 MG PO TABS: 1.0000 | ORAL_TABLET | Freq: Two times a day (BID) | ORAL | Status: DC | PRN
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
## 2014-02-12 NOTE — ED Notes (Signed)
Pt rates pain 10/10 at discharge.  EDP aware.  Pt sent home with prescription for pain medication

## 2014-02-12 NOTE — Discharge Instructions (Signed)
Arthralgia Brooke Guerra, you were seen today for shoulder pain and back pain. Your x-rays and CT scan were negative. Continue to take pain medication at home as needed and followup with orthopedic surgery within 3 days for continued treatment. If your symptoms worsen, come back to the emergency department for repeat evaluation. Thank you. Arthralgia is joint pain. A joint is a place where two bones meet. Joint pain can happen for many reasons. The joint can be bruised, stiff, infected, or weak from aging. Pain usually goes away after resting and taking medicine for soreness.  HOME CARE  Rest the joint as told by your doctor.  Keep the sore joint raised (elevated) for the first 24 hours.  Put ice on the joint area.  Put ice in a plastic bag.  Place a towel between your skin and the bag.  Leave the ice on for 15-20 minutes, 03-04 times a day.  Wear your splint, casting, elastic bandage, or sling as told by your doctor.  Only take medicine as told by your doctor. Do not take aspirin.  Use crutches as told by your doctor. Do not put weight on the joint until told to by your doctor. GET HELP RIGHT AWAY IF:   You have bruising, puffiness (swelling), or more pain.  Your fingers or toes turn blue or start to lose feeling (numb).  Your medicine does not lessen the pain.  Your pain becomes severe.  You have a temperature by mouth above 102 F (38.9 C), not controlled by medicine.  You cannot move or use the joint. MAKE SURE YOU:   Understand these instructions.  Will watch your condition.  Will get help right away if you are not doing well or get worse. Document Released: 04/09/2009 Document Revised: 07/14/2011 Document Reviewed: 04/09/2009 [facility] Patient Information 2015 [place], [place]. This information is not intended to replace advice given to you by your health care provider. Make sure you discuss any questions you have with your health care provider.

## 2014-07-09 IMAGING — RF DG ESOPHAGUS
14 of 15 series · 14 of 15 positions shown · non-contrast
Comparison: None.

CLINICAL DATA: The patient, history of reflux

ESOPHAGUS/BARIUM SWALLOW/TABLET STUDY
TECHNIQUE: Barium swallow was performed using 45 seconds of
fluoroscopy.  Effervescent crystals were administered initially
followed by thick and subsequently thin barium.

[Series 3: run · 1 of 1 slices shown (1 of 14)]
[im 1/1]
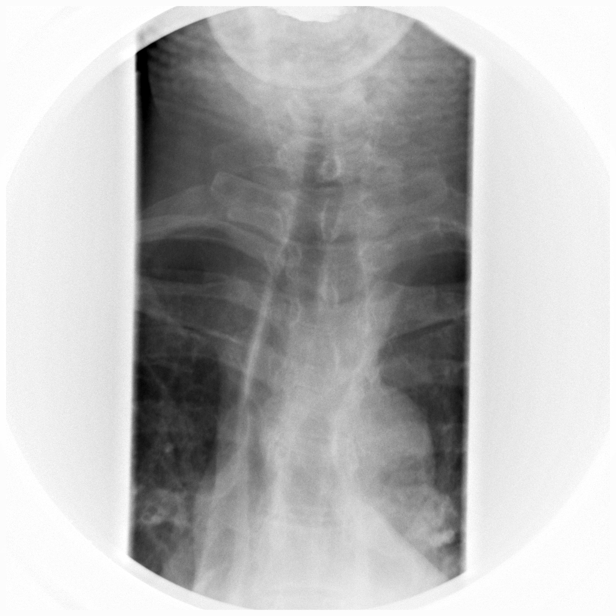

[Series 4: run · 1 of 1 slices shown (2 of 14)]
[im 1/1]
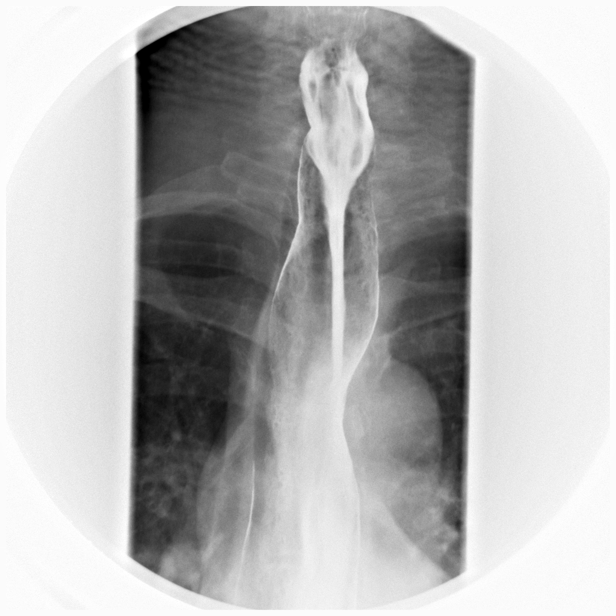

[Series 5: run · 1 of 1 slices shown (3 of 14)]
[im 1/1]
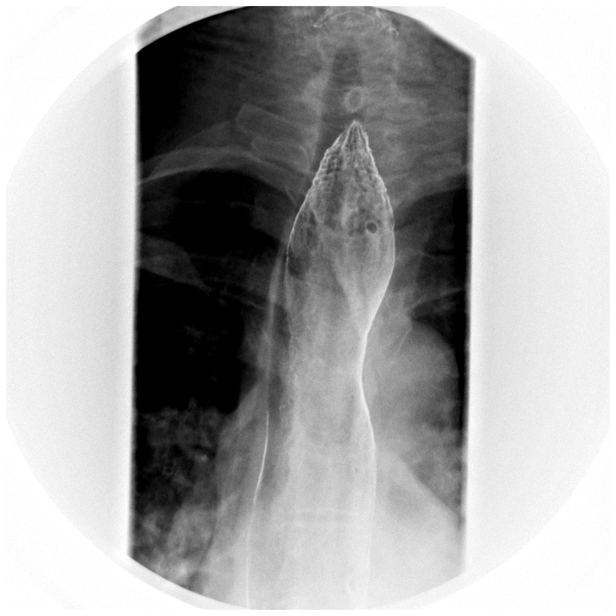

[Series 6: run · 1 of 1 slices shown (4 of 14)]
[im 1/1]
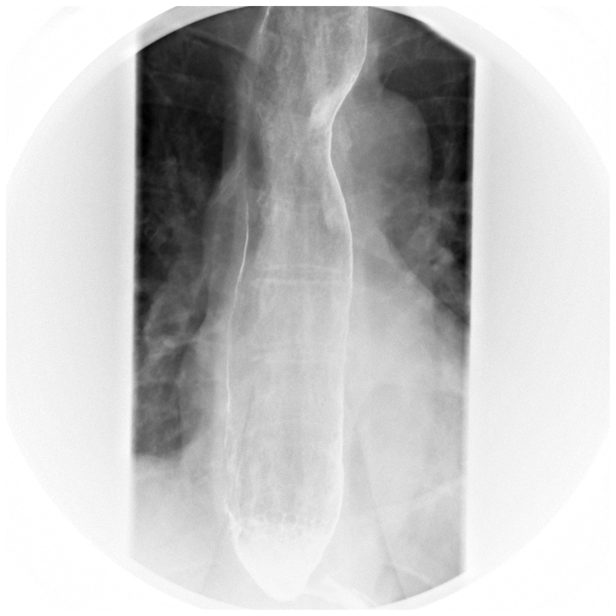

[Series 7: run · 1 of 1 slices shown (5 of 14)]
[im 1/1]
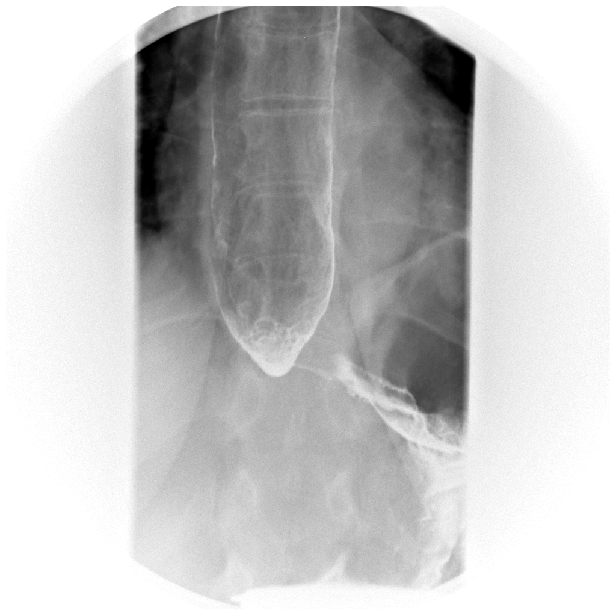

[Series 8: run · 1 of 1 slices shown (6 of 14)]
[im 1/1]
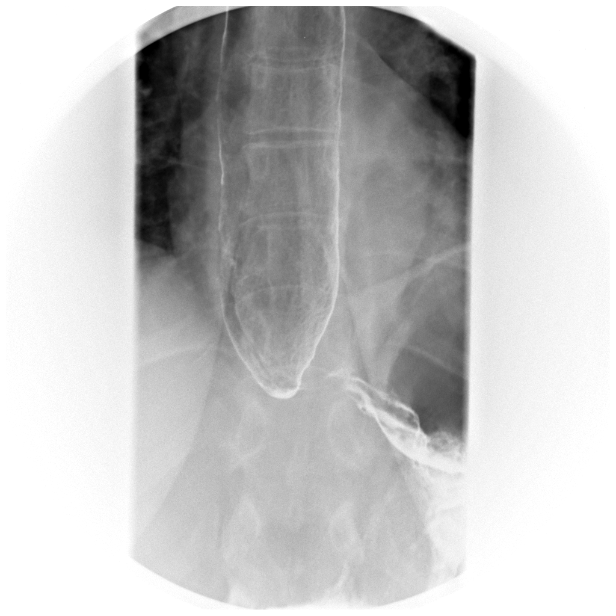

[Series 9: run · 1 of 1 slices shown (7 of 14)]
[im 1/1]
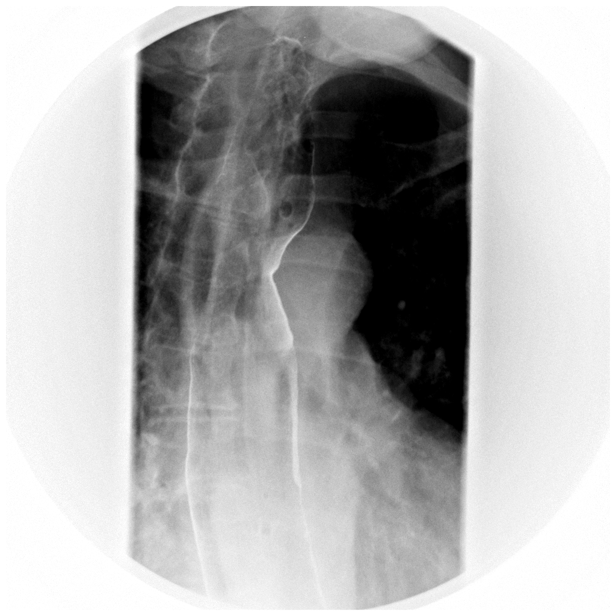

[Series 11: run · 1 of 1 slices shown (8 of 14)]
[im 1/1]
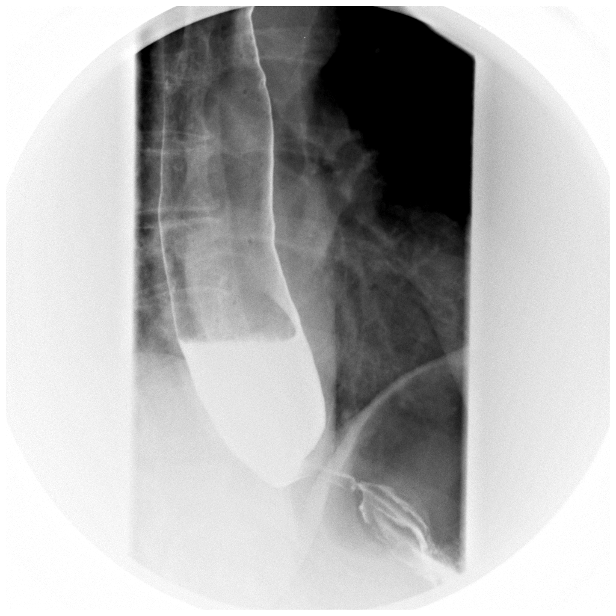

[Series 12: run · 1 of 1 slices shown (9 of 14)]
[im 1/1]
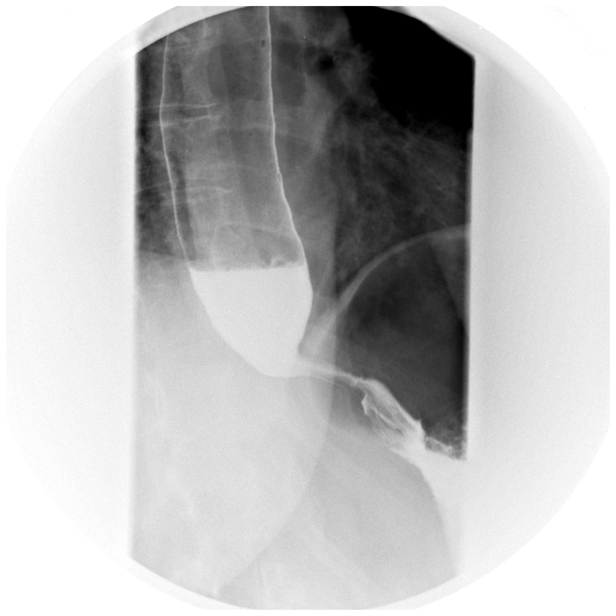

[Series 13: run · 1 of 1 slices shown (10 of 14)]
[im 1/1]
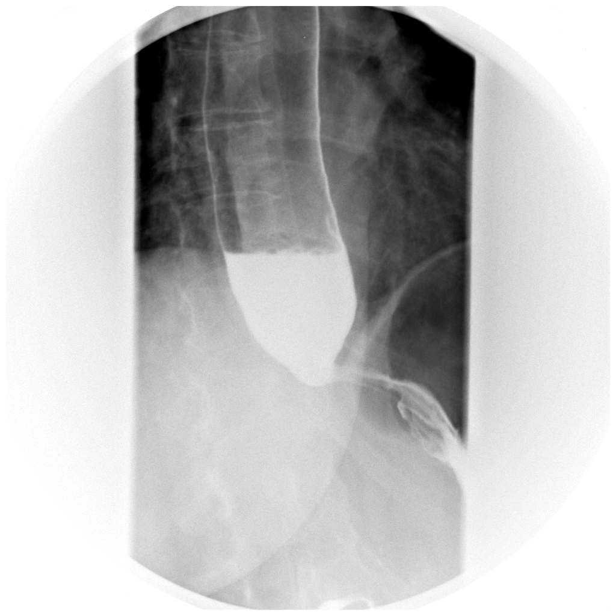

[Series 14: run · 1 of 1 slices shown (11 of 14)]
[im 1/1]
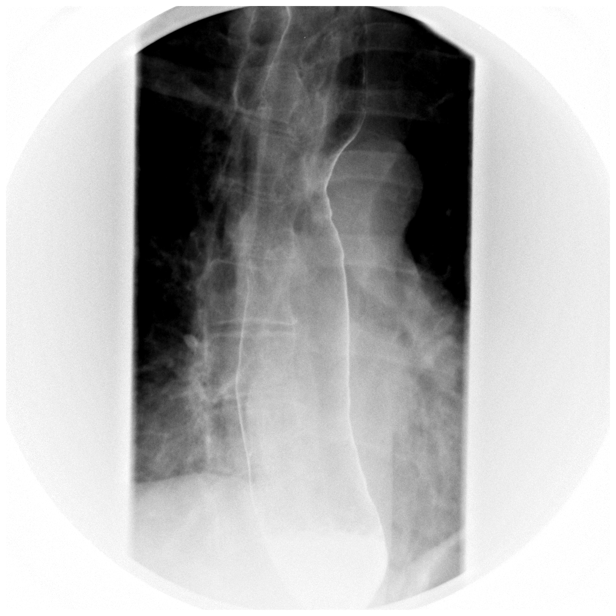

[Series 15: run · 1 of 1 slices shown (12 of 14)]
[im 1/1]
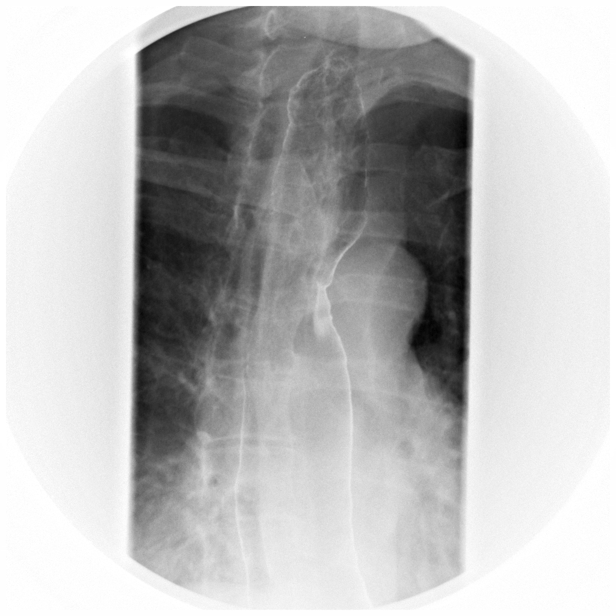

[Series 16: run · 1 of 1 slices shown (13 of 14)]
[im 1/1]
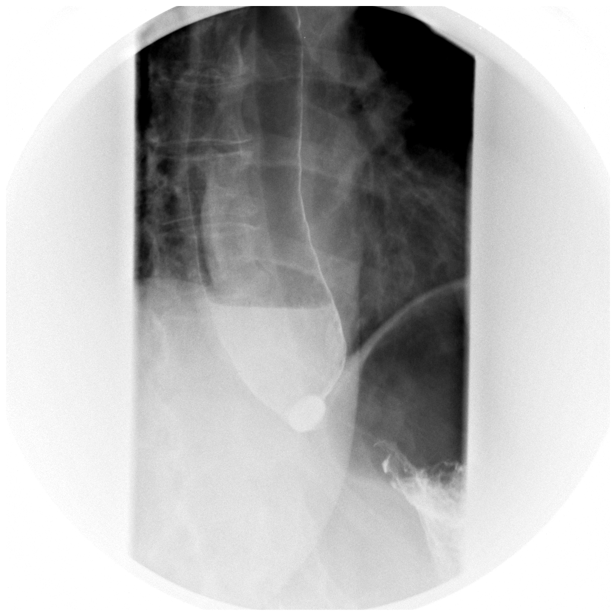

[Series 17: run · 1 of 1 slices shown (14 of 14)]
[im 1/1]
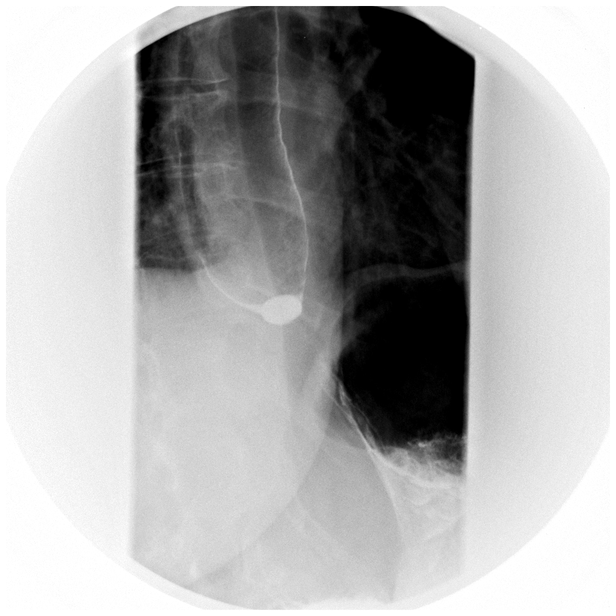

[14 of 15 positions shown; findings below may reference images not displayed]

FINDINGS: There is normal esophageal motility.  The distal
esophagus is significantly narrowed just above the gastroesophageal
junction.  Barium passed through the area of persistent narrowing
with relative these.  A barium tablet was administered and did not
pass through the gastroesophageal junction despite numerous sips of
water.  The remainder of the proximal esophagus is mildly to
moderately dilated.  At the area of narrowing, there is no
significant mucosal irregularity to obviously suggest the presence
of a mass.
IMPRESSION: Tight narrowing at the gastroesophageal junction and distal
esophagus.  Differential diagnostic possibilities include
achalasia, fibrosis from chronic gastroesophageal reflux, or, less
likely, mass.  Consider further endoscopic evaluation.

## 2014-07-14 ENCOUNTER — Other Ambulatory Visit: Payer: Self-pay

## 2014-07-14 DIAGNOSIS — Z1231 Encounter for screening mammogram for malignant neoplasm of breast: Secondary | ICD-10-CM

## 2014-07-20 ENCOUNTER — Ambulatory Visit
Admission: RE | Admit: 2014-07-20 | Discharge: 2014-07-20 | Disposition: A | Discharge status: Home / Self Care | Payer: Medicaid Other | Source: Ambulatory Visit

## 2014-07-20 DIAGNOSIS — Z1231 Encounter for screening mammogram for malignant neoplasm of breast: Secondary | ICD-10-CM

## 2014-08-01 ENCOUNTER — Encounter: Payer: Self-pay | Admitting: *Deleted

## 2014-08-23 ENCOUNTER — Encounter: Payer: Self-pay | Admitting: Internal Medicine

## 2014-08-24 ENCOUNTER — Encounter: Payer: Medicaid Other | Admitting: Obstetrics & Gynecology

## 2014-09-12 ENCOUNTER — Telehealth: Payer: Self-pay

## 2014-09-12 NOTE — Telephone Encounter (Signed)
Ok with me 

## 2014-09-12 NOTE — Telephone Encounter (Signed)
Pt was pt of JMP; now wants DB.  OK?

## 2014-09-15 ENCOUNTER — Encounter: Payer: Self-pay | Admitting: Internal Medicine

## 2014-09-27 ENCOUNTER — Encounter: Payer: Medicaid Other | Admitting: Internal Medicine

## 2014-10-27 ENCOUNTER — Encounter: Payer: Self-pay | Admitting: Obstetrics & Gynecology

## 2014-10-27 ENCOUNTER — Ambulatory Visit (INDEPENDENT_AMBULATORY_CARE_PROVIDER_SITE_OTHER): Payer: Medicaid Other | Admitting: Obstetrics & Gynecology

## 2014-10-27 ENCOUNTER — Other Ambulatory Visit (HOSPITAL_COMMUNITY)
Admission: RE | Admit: 2014-10-27 | Discharge: 2014-10-27 | Disposition: A | Discharge status: Home / Self Care | Payer: Medicaid Other | Source: Ambulatory Visit | Attending: Obstetrics & Gynecology | Admitting: Obstetrics & Gynecology

## 2014-10-27 VITALS — BP 150/97 | HR 74 | Ht 65.0 in | Wt 187.1 lb

## 2014-10-27 DIAGNOSIS — Z0000 Encounter for general adult medical examination without abnormal findings: Secondary | ICD-10-CM

## 2014-10-27 DIAGNOSIS — Z1151 Encounter for screening for human papillomavirus (HPV): Secondary | ICD-10-CM | POA: Insufficient documentation

## 2014-10-27 DIAGNOSIS — Z01419 Encounter for gynecological examination (general) (routine) without abnormal findings: Secondary | ICD-10-CM | POA: Diagnosis present

## 2014-10-27 DIAGNOSIS — Z124 Encounter for screening for malignant neoplasm of cervix: Secondary | ICD-10-CM

## 2014-10-27 NOTE — Progress Notes (Signed)
Patient ID: Hydeia Mcatee, female   DOB: Nov 03, 1960, 54 y.o.   MRN: [id number] Subjective:     Marilyn Nihiser is a 54 y.o. female here for a routine exam.  Current complaints: none.  Pt s/p hyst 1994.  Had supracervical hyst. No bleeding since that time.  No h/o cancer.   Gynecologic History No LMP recorded. Patient has had a hysterectomy. Contraception: status post hysterectomy Last Pap: 05/2006. Results were: normal Last mammogram: 09/2014. Results were: normal  Obstetric History OB History  Gravida Para Term Preterm AB SAB TAB Ectopic Multiple Living  4 4 4       4     # Outcome Date GA Lbr Len/2nd Weight Sex Delivery Anes PTL Lv  4 Term      Vag-Spont     3 Term      Vag-Spont     2 Term      Vag-Spont     1 Term      Vag-Spont          The following portions of the patient's history were reviewed and updated as appropriate: allergies, current medications, past family history, past medical history, past social history, past surgical history and problem list.  Review of Systems Pertinent items are noted in HPI.    Objective:    BP 150/97 mmHg  Pulse 74  Ht 5\' 5"  (1.651 m)  Wt 187 lb 1.6 oz (84.868 kg)  BMI 31.14 kg/m2  General Appearance:    Alert, cooperative, no distress, appears stated age  Head:    Normocephalic, without obvious abnormality, atraumatic  Eyes:    conjunctiva/corneas clear, EOM's intact        Throat:   Lips, mucosa, and tongue normal; teeth and gums normal  Neck:   Supple, symmetrical, trachea midline, no adenopathy;    thyroid:  no enlargement/tenderness/nodules; no carotid   bruit or JVD  Back:     Symmetric, no curvature, ROM normal, no CVA tenderness  Lungs:     Clear to auscultation bilaterally, respirations unlabored  Chest Wall:    No tenderness or deformity   Heart:    Regular rate and rhythm, S1 and S2 normal, no murmur, rub   or gallop  Breast Exam:    No tenderness, masses, or nipple abnormality  Abdomen:     Soft, non-tender, bowel  sounds active all four quadrants,    no masses, no organomegaly  Genitalia:    Normal female without lesion, discharge or tenderness     Extremities:   Extremities normal, atraumatic, no cyanosis or edema  Pulses:   2+ and symmetric all extremities  Skin:   Skin color, texture, turgor normal, no rashes or lesions            Assessment:    Healthy female exam.   Cervix intact.  Uterus surgically removed    Plan:    Follow up in: 1 year.    F/u PAP

## 2014-10-27 NOTE — Patient Instructions (Signed)
Menopause Menopause is the normal time of life when menstrual periods stop completely. Menopause is complete when you have missed 12 consecutive menstrual periods. It usually occurs between the ages of 48 years and 55 years. Very rarely does a woman develop menopause before the age of 40 years. At menopause, your ovaries stop producing the female hormones estrogen and progesterone. This can cause undesirable symptoms and also affect your health. Sometimes the symptoms may occur 4-5 years before the menopause begins. There is no relationship between menopause and:  Oral contraceptives.  Number of children you had.  Race.  The age your menstrual periods started (menarche). Heavy smokers and very thin women may develop menopause earlier in life. CAUSES  The ovaries stop producing the female hormones estrogen and progesterone.  Other causes include:  Surgery to remove both ovaries.  The ovaries stop functioning for no known reason.  Tumors of the pituitary gland in the brain.  Medical disease that affects the ovaries and hormone production.  Radiation treatment to the abdomen or pelvis.  Chemotherapy that affects the ovaries. SYMPTOMS   Hot flashes.  Night sweats.  Decrease in sex drive.  Vaginal dryness and thinning of the vagina causing painful intercourse.  Dryness of the skin and developing wrinkles.  Headaches.  Tiredness.  Irritability.  Memory problems.  Weight gain.  Bladder infections.  Hair growth of the face and chest.  Infertility. More serious symptoms include:  Loss of bone (osteoporosis) causing breaks (fractures).  Depression.  Hardening and narrowing of the arteries (atherosclerosis) causing heart attacks and strokes. DIAGNOSIS   When the menstrual periods have stopped for 12 straight months.  Physical exam.  Hormone studies of the blood. TREATMENT  There are many treatment choices and nearly as many questions about them. The  decisions to treat or not to treat menopausal changes is an individual choice made with your health care provider. Your health care provider can discuss the treatments with you. Together, you can decide which treatment will work best for you. Your treatment choices may include:   Hormone therapy (estrogen and progesterone).  Non-hormonal medicines.  Treating the individual symptoms with medicine (for example antidepressants for depression).  Herbal medicines that may help specific symptoms.  Counseling by a psychiatrist or psychologist.  Group therapy.  Lifestyle changes including:  Eating healthy.  Regular exercise.  Limiting caffeine and alcohol.  Stress management and meditation.  No treatment. HOME CARE INSTRUCTIONS   Take the medicine your health care provider gives you as directed.  Get plenty of sleep and rest.  Exercise regularly.  Eat a diet that contains calcium (good for the bones) and soy products (acts like estrogen hormone).  Avoid alcoholic beverages.  Do not smoke.  If you have hot flashes, dress in layers.  Take supplements, calcium, and vitamin D to strengthen bones.  You can use over-the-counter lubricants or moisturizers for vaginal dryness.  Group therapy is sometimes very helpful.  Acupuncture may be helpful in some cases. SEEK MEDICAL CARE IF:   You are not sure you are in menopause.  You are having menopausal symptoms and need advice and treatment.  You are still having menstrual periods after age 55 years.  You have pain with intercourse.  Menopause is complete (no menstrual period for 12 months) and you develop vaginal bleeding.  You need a referral to a specialist (gynecologist, psychiatrist, or psychologist) for treatment. SEEK IMMEDIATE MEDICAL CARE IF:   You have severe depression.  You have excessive vaginal bleeding.    You fell and think you have a broken bone.  You have pain when you urinate.  You develop leg or  chest pain.  You have a fast pounding heart beat (palpitations).  You have severe headaches.  You develop vision problems.  You feel a lump in your breast.  You have abdominal pain or severe indigestion. Document Released: 07/12/2003 Document Revised: 12/22/2012 Document Reviewed: 11/18/2012 [employer] Patient Information 2015 [employer]. This information is not intended to replace advice given to you by your health care provider. Make sure you discuss any questions you have with your health care provider.  

## 2014-10-30 LAB — CYTOLOGY - PAP

## 2014-10-31 ENCOUNTER — Ambulatory Visit (AMBULATORY_SURGERY_CENTER): Payer: Self-pay

## 2014-10-31 VITALS — Ht 65.0 in | Wt 186.4 lb

## 2014-10-31 DIAGNOSIS — Z1211 Encounter for screening for malignant neoplasm of colon: Secondary | ICD-10-CM

## 2014-10-31 MED ORDER — MOVIPREP 100 G PO SOLR: 1.0000 | Freq: Once | ORAL | Status: AC

## 2014-10-31 NOTE — Progress Notes (Signed)
No allergies to eggs or soy No diet/weight loss meds No past problems with anesthesia except REFLUX No home oxygen  No email

## 2014-11-07 ENCOUNTER — Telehealth: Payer: Self-pay | Admitting: Internal Medicine

## 2014-11-07 ENCOUNTER — Encounter: Payer: Medicaid Other | Admitting: Internal Medicine

## 2014-11-07 NOTE — Telephone Encounter (Signed)
Please charge cancellation fee.

## 2014-11-08 ENCOUNTER — Encounter: Payer: Medicaid Other | Admitting: Internal Medicine

## 2014-11-16 NOTE — Addendum Note (Signed)
Addended by: Steva Ready on: 11/16/2014 10:57 AM   Modules accepted: Level of Service

## 2015-01-10 ENCOUNTER — Encounter: Payer: Medicaid Other | Admitting: Internal Medicine

## 2015-03-08 ENCOUNTER — Other Ambulatory Visit: Payer: Self-pay | Admitting: Internal Medicine

## 2015-03-08 ENCOUNTER — Ambulatory Visit
Admission: RE | Admit: 2015-03-08 | Discharge: 2015-03-08 | Disposition: A | Discharge status: Home / Self Care | Payer: Medicaid Other | Source: Ambulatory Visit | Attending: Internal Medicine | Admitting: Internal Medicine

## 2015-03-08 DIAGNOSIS — J449 Chronic obstructive pulmonary disease, unspecified: Secondary | ICD-10-CM

## 2015-04-12 DIAGNOSIS — Z23 Encounter for immunization: Secondary | ICD-10-CM

## 2015-04-21 NOTE — Congregational Nurse Program (Signed)
Congregational Nurse Program Note  Date of Encounter: 04/12/2015  Past Medical History: Past Medical History  Diagnosis Date  . Asthma   . GERD (gastroesophageal reflux disease)   . Hypertension     Encounter Details:     CNP Questionnaire - 04/12/15 1249    Patient Demographics   Is this a new or existing patient? New   Patient is considered a/an Not Applicable   Patient Assistance   Patient's financial/insurance status Medicaid;Low Income   Patient referred to apply for the following financial assistance Not Applicable   Food insecurities addressed Not Applicable   Transportation assistance No   Assistance securing medications No   Educational health offerings Not Applicable   Encounter Details   Primary purpose of visit Education/Health Concerns   Was an Emergency Department visit averted? Not Applicable   Does patient have a medical provider? Yes   Patient referred to Not Applicable   Was a mental health screening completed? (GAINS tool) No   Does patient have dental issues? No   Since previous encounter, have you referred patient for abnormal blood pressure that resulted in a new diagnosis or medication change? No   Since previous encounter, have you referred patient for abnormal blood glucose that resulted in a new diagnosis or medication change? No   For Abstraction Use Only   Does patient have insurance? Yes    clinic note:  See for flu immunization

## 2015-04-24 NOTE — Congregational Nurse Program (Signed)
Congregational Nurse Program Note  Date of Encounter: 04/12/2015  Past Medical History: Past Medical History  Diagnosis Date  . Asthma   . GERD (gastroesophageal reflux disease)   . Hypertension     Encounter Details:     CNP Questionnaire - 04/12/15 1249    Patient Demographics   Is this a new or existing patient? New   Patient is considered a/an Not Applicable   Patient Assistance   Patient's financial/insurance status Medicaid;Low Income   Patient referred to apply for the following financial assistance Not Applicable   Food insecurities addressed Not Applicable   Transportation assistance No   Assistance securing medications No   Educational health offerings Not Applicable   Encounter Details   Primary purpose of visit Education/Health Concerns   Was an Emergency Department visit averted? Not Applicable   Does patient have a medical provider? Yes   Patient referred to Not Applicable   Was a mental health screening completed? (GAINS tool) No   Does patient have dental issues? No   Since previous encounter, have you referred patient for abnormal blood pressure that resulted in a new diagnosis or medication change? No   Since previous encounter, have you referred patient for abnormal blood glucose that resulted in a new diagnosis or medication change? No   For Abstraction Use Only   Does patient have insurance? Yes     clinic visit - flu immunization given

## 2016-10-14 ENCOUNTER — Emergency Department (HOSPITAL_COMMUNITY)
Admission: EM | Admit: 2016-10-14 | Discharge: 2016-10-14 | Disposition: A | Discharge status: Home / Self Care | Payer: No Typology Code available for payment source | Attending: Emergency Medicine | Admitting: Emergency Medicine

## 2016-10-14 ENCOUNTER — Emergency Department (HOSPITAL_COMMUNITY): Payer: No Typology Code available for payment source

## 2016-10-14 ENCOUNTER — Encounter (HOSPITAL_COMMUNITY): Payer: Self-pay

## 2016-10-14 DIAGNOSIS — Y999 Unspecified external cause status: Secondary | ICD-10-CM | POA: Diagnosis not present

## 2016-10-14 DIAGNOSIS — I10 Essential (primary) hypertension: Secondary | ICD-10-CM | POA: Insufficient documentation

## 2016-10-14 DIAGNOSIS — Y9389 Activity, other specified: Secondary | ICD-10-CM | POA: Diagnosis not present

## 2016-10-14 DIAGNOSIS — F17210 Nicotine dependence, cigarettes, uncomplicated: Secondary | ICD-10-CM | POA: Insufficient documentation

## 2016-10-14 DIAGNOSIS — Y92410 Unspecified street and highway as the place of occurrence of the external cause: Secondary | ICD-10-CM | POA: Insufficient documentation

## 2016-10-14 DIAGNOSIS — S161XXA Strain of muscle, fascia and tendon at neck level, initial encounter: Secondary | ICD-10-CM | POA: Diagnosis not present

## 2016-10-14 DIAGNOSIS — J45909 Unspecified asthma, uncomplicated: Secondary | ICD-10-CM | POA: Insufficient documentation

## 2016-10-14 DIAGNOSIS — Z79899 Other long term (current) drug therapy: Secondary | ICD-10-CM | POA: Diagnosis not present

## 2016-10-14 DIAGNOSIS — S199XXA Unspecified injury of neck, initial encounter: Secondary | ICD-10-CM | POA: Diagnosis present

## 2016-10-14 MED ORDER — CYCLOBENZAPRINE HCL 5 MG PO TABS: 5.0000 mg | ORAL_TABLET | Freq: Two times a day (BID) | ORAL | 0 refills | Status: DC | PRN

## 2016-10-14 MED ORDER — CYCLOBENZAPRINE HCL 10 MG PO TABS
5.0000 mg | ORAL_TABLET | Freq: Once | ORAL | Status: AC
  Administered 2016-10-14: 5 mg via ORAL
  Filled 2016-10-14: qty 1
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
## 2016-10-14 NOTE — ED Notes (Signed)
See EDP secondary assessment.  

## 2018-07-19 ENCOUNTER — Other Ambulatory Visit: Payer: Self-pay | Admitting: Internal Medicine

## 2018-07-19 DIAGNOSIS — D179 Benign lipomatous neoplasm, unspecified: Secondary | ICD-10-CM

## 2019-04-28 ENCOUNTER — Other Ambulatory Visit: Payer: Self-pay

## 2019-04-28 ENCOUNTER — Ambulatory Visit (HOSPITAL_COMMUNITY)
Admission: EM | Admit: 2019-04-28 | Discharge: 2019-04-28 | Disposition: A | Discharge status: Home / Self Care | Payer: BLUE CROSS/BLUE SHIELD | Attending: Family Medicine | Admitting: Family Medicine

## 2019-04-28 ENCOUNTER — Encounter (HOSPITAL_COMMUNITY): Payer: Self-pay | Admitting: Family Medicine

## 2019-04-28 DIAGNOSIS — H6122 Impacted cerumen, left ear: Secondary | ICD-10-CM | POA: Diagnosis not present

## 2019-04-28 DIAGNOSIS — K047 Periapical abscess without sinus: Secondary | ICD-10-CM

## 2019-04-28 MED ORDER — HYDROCODONE-ACETAMINOPHEN 5-325 MG PO TABS: 1.0000 | ORAL_TABLET | Freq: Four times a day (QID) | ORAL | 0 refills | Status: AC | PRN

## 2019-04-28 MED ORDER — PENICILLIN V POTASSIUM 500 MG PO TABS: 500.0000 mg | ORAL_TABLET | Freq: Four times a day (QID) | ORAL | 0 refills | Status: AC

## 2019-04-28 NOTE — ED Provider Notes (Signed)
[facility]    CSN: [id number] Arrival date & time: 04/28/19  1041      History   Chief Complaint Chief Complaint  Patient presents with  . Dental Pain    Left Side    HPI David Towson is a 58 y.o. female.   58 yo initial visit to [facility]  Patient complains of dental problem.  The pain comes from infected molar left lower jaw and radiates into left ear.  Pain started two weeks ago and left face started swelling yesterday.     Past Medical History:  Diagnosis Date  . Asthma   . GERD (gastroesophageal reflux disease)   . Hypertension     Patient Active Problem List   Diagnosis Date Noted  . Achalasia s/p Heller myotomy and Dor fundoplication 11/08/09 65/79/0383  . Chest pain 07/23/2012    Past Surgical History:  Procedure Laterality Date  . ABDOMINAL HYSTERECTOMY    . ESOPHAGEAL MANOMETRY N/A 08/02/2012   Procedure: ESOPHAGEAL MANOMETRY (EM);  Surgeon: Jerene Bears, MD;  Location: [facility];  Service: Gastroenterology;  Laterality: N/A;  . ESOPHAGOGASTRODUODENOSCOPY (EGD) WITH ESOPHAGEAL DILATION N/A 07/25/2012   Procedure: ESOPHAGOGASTRODUODENOSCOPY (EGD) WITH ESOPHAGEAL DILATION;  Surgeon: Jerene Bears, MD;  Location: [facility];  Service: Gastroenterology;  Laterality: N/A;  . ESOPHAGOGASTRODUODENOSCOPY ENDOSCOPY N/A 09/07/2012   Procedure: Upper Endoscopy;  Surgeon: Odis Hollingshead, MD;  [facility];  Service: General;  Laterality: N/A;  . HELLER MYOTOMY N/A 09/07/2012   Procedure: LAPAROSCOPIC HELLER MYOTOMY;  Surgeon: Odis Hollingshead, MD;  [facility];  Service: General;  Laterality: N/A;  . TOE SURGERY     toe fx    OB History    Gravida  4   Para  4   Term  4   Preterm      AB      Living  4     SAB      TAB      Ectopic      Multiple      Live Births               Home Medications    Prior to Admission medications   Medication Sig Start Date End Date Taking? Authorizing Provider  acetaminophen  (TYLENOL) 500 MG tablet Take 1,000 mg by mouth every 6 (six) hours as needed for moderate pain.    [provider]  HYDROcodone-acetaminophen (NORCO) 5-325 MG tablet Take 1 tablet by mouth every 6 (six) hours as needed for moderate pain. 04/28/19   Robyn Haber, MD  MOVIPREP 100 G SOLR Take 1 kit (200 g total) by mouth once. 10/31/14   Lafayette Dragon, MD  penicillin v potassium (VEETID) 500 MG tablet Take 1 tablet (500 mg total) by mouth 4 (four) times daily for 7 days. 04/28/19 05/05/19  Robyn Haber, MD    Family History Family History  Problem Relation Age of Onset  . Diabetes Sister   . Cancer Sister        unknown  . Heart attack Maternal Uncle 61  . Colon cancer Neg Hx     Social History Social History   Tobacco Use  . Smoking status: Current Every Day Smoker    Packs/day: 0.25    Years: 20.00    Pack years: 5.00    Types: Cigarettes  . Smokeless tobacco: Never Used  Substance Use Topics  . Alcohol use: Yes    Alcohol/week: 0.0 standard drinks  Comment: occassional;holidays  . Drug use: No     Allergies   Patient has no known allergies.   Review of Systems Review of Systems   Physical Exam Triage Vital Signs ED Triage Vitals  Enc Vitals Group     BP      Pulse      Resp      Temp      Temp src      SpO2      Weight      Height      Head Circumference      Peak Flow      Pain Score      Pain Loc      Pain Edu?      Excl. in [place]?    No data found.  Updated Vital Signs BP 130/90 (BP Location: Left Arm)   Pulse 94   Temp 99 F (37.2 C) (Oral)   Resp 17   SpO2 95%    Physical Exam Constitutional:      Appearance: She is normal weight.  HENT:     Head:     Comments: Swollen left cheek    Left Ear: There is impacted cerumen.     Mouth/Throat:     Comments: Swollen gum at tooth #18 Eyes:     Conjunctiva/sclera: Conjunctivae normal.  Cardiovascular:     Rate and Rhythm: Normal rate.  Pulmonary:     Effort: Pulmonary  effort is normal.  Musculoskeletal:        General: Normal range of motion.     Cervical back: Normal range of motion and neck supple.  Lymphadenopathy:     Cervical: Cervical adenopathy present.  Skin:    General: Skin is warm and dry.  Neurological:     General: No focal deficit present.     Mental Status: She is alert.  Psychiatric:        Mood and Affect: Mood normal.      UC Treatments / Results  Labs (all labs ordered are listed, but only abnormal results are displayed) Labs Reviewed - No data to display  EKG   Radiology No results found.  Procedures Procedures (including critical care time)  Medications Ordered in UC Medications - No data to display  Initial Impression / Assessment and Plan / UC Course  I have reviewed the triage vital signs and the nursing notes.  Pertinent labs & imaging results that were available during my care of the patient were reviewed by me and considered in my medical decision making (see chart for details).    Final Clinical Impressions(s) / UC Diagnoses   Final diagnoses:  Dental abscess  Impacted cerumen of left ear   Discharge Instructions   None    ED Prescriptions    Medication Sig Dispense Auth. Provider   HYDROcodone-acetaminophen (NORCO) 5-325 MG tablet Take 1 tablet by mouth every 6 (six) hours as needed for moderate pain. 12 tablet Robyn Haber, MD   penicillin v potassium (VEETID) 500 MG tablet Take 1 tablet (500 mg total) by mouth 4 (four) times daily for 7 days. 40 tablet Robyn Haber, MD     I have reviewed the PDMP during this encounter.   Robyn Haber, MD 04/28/19 1105

## 2019-04-28 NOTE — ED Triage Notes (Signed)
Pt presents with left side dental pain X 1 week.

## 2020-10-31 ENCOUNTER — Other Ambulatory Visit: Payer: Self-pay

## 2020-10-31 ENCOUNTER — Ambulatory Visit (HOSPITAL_COMMUNITY)
Admission: EM | Admit: 2020-10-31 | Discharge: 2020-10-31 | Discharge status: Against Medical Advice | Payer: BLUE CROSS/BLUE SHIELD

## 2020-11-05 ENCOUNTER — Ambulatory Visit (HOSPITAL_COMMUNITY)
Admission: EM | Admit: 2020-11-05 | Discharge: 2020-11-05 | Disposition: A | Discharge status: Home / Self Care | Payer: 59 | Attending: Urgent Care | Admitting: Urgent Care

## 2020-11-05 ENCOUNTER — Encounter (HOSPITAL_COMMUNITY): Payer: Self-pay

## 2020-11-05 ENCOUNTER — Other Ambulatory Visit: Payer: Self-pay

## 2020-11-05 ENCOUNTER — Telehealth (HOSPITAL_COMMUNITY): Payer: Self-pay

## 2020-11-05 ENCOUNTER — Ambulatory Visit (INDEPENDENT_AMBULATORY_CARE_PROVIDER_SITE_OTHER): Payer: 59

## 2020-11-05 DIAGNOSIS — M79672 Pain in left foot: Secondary | ICD-10-CM

## 2020-11-05 DIAGNOSIS — M79674 Pain in right toe(s): Secondary | ICD-10-CM

## 2020-11-05 DIAGNOSIS — S92501A Displaced unspecified fracture of right lesser toe(s), initial encounter for closed fracture: Secondary | ICD-10-CM | POA: Diagnosis not present

## 2020-11-05 MED ORDER — NAPROXEN 375 MG PO TABS: 375.0000 mg | ORAL_TABLET | Freq: Two times a day (BID) | ORAL | 0 refills | Status: AC

## 2020-11-05 MED ORDER — NAPROXEN 375 MG PO TABS: 375.0000 mg | ORAL_TABLET | Freq: Two times a day (BID) | ORAL | 0 refills | Status: DC

## 2020-11-05 NOTE — Discharge Instructions (Addendum)
[facility] ([place]) Podiatrist in [place], [place] COVID-19 info: [email or web address] Get online care: [email or web address] Address: [address], [place], [place] [address] Phone: [phone] Appointments: [email or web address]   [facility] [place], [place] [facility] in [place], [place] Address: [address], [place], [place] [address] Phone: [phone]  

## 2020-11-05 NOTE — ED Provider Notes (Signed)
[place] [facility]   MRN: [id number] DOB: May 20, 1960  Subjective:   Brooke Guerra is a 60 y.o. female presenting for suffering a crush injury to the right fourth toe 2 weeks ago.  Patient states that she stubbed it against the frame of the shower door.  It has been persistently painful and swollen since then.  She is able to bear weight but with pain.  Denies taking chronic medications.  No Known Allergies  Past Medical History:  Diagnosis Date   Asthma    GERD (gastroesophageal reflux disease)    Hypertension      Past Surgical History:  Procedure Laterality Date   ABDOMINAL HYSTERECTOMY     ESOPHAGEAL MANOMETRY N/A 08/02/2012   Procedure: ESOPHAGEAL MANOMETRY (EM);  Surgeon: Jerene Bears, MD;  Location: [facility];  Service: Gastroenterology;  Laterality: N/A;   ESOPHAGOGASTRODUODENOSCOPY (EGD) WITH ESOPHAGEAL DILATION N/A 07/25/2012   Procedure: ESOPHAGOGASTRODUODENOSCOPY (EGD) WITH ESOPHAGEAL DILATION;  Surgeon: Jerene Bears, MD;  Location: [facility];  Service: Gastroenterology;  Laterality: N/A;   ESOPHAGOGASTRODUODENOSCOPY ENDOSCOPY N/A 09/07/2012   Procedure: Upper Endoscopy;  Surgeon: Odis Hollingshead, MD;  [facility];  Service: General;  Laterality: N/A;   HELLER MYOTOMY N/A 09/07/2012   Procedure: LAPAROSCOPIC HELLER MYOTOMY;  Surgeon: Odis Hollingshead, MD;  [facility];  Service: General;  Laterality: N/A;   TOE SURGERY     toe fx    Family History  Problem Relation Age of Onset   Diabetes Sister    Cancer Sister        unknown   Heart attack Maternal Uncle 61   Colon cancer Neg Hx     Social History   Tobacco Use   Smoking status: Every Day    Packs/day: 0.25    Years: 20.00    Pack years: 5.00    Types: Cigarettes   Smokeless tobacco: Never  Substance Use Topics   Alcohol use: Yes    Alcohol/week: 0.0 standard drinks    Comment: occassional;holidays   Drug use: No    ROS   Objective:   Vitals: BP 131/81 (BP  Location: Right Arm)   Pulse 76   Temp 98.9 F (37.2 C) (Oral)   Resp 18   SpO2 98%   Physical Exam Constitutional:      General: She is not in acute distress.    Appearance: Normal appearance. She is well-developed. She is not ill-appearing.  HENT:     Head: Normocephalic and atraumatic.     Nose: Nose normal.     Mouth/Throat:     Mouth: Mucous membranes are moist.     Pharynx: Oropharynx is clear.  Eyes:     General: No scleral icterus.    Extraocular Movements: Extraocular movements intact.     Pupils: Pupils are equal, round, and reactive to light.  Cardiovascular:     Rate and Rhythm: Normal rate.  Pulmonary:     Effort: Pulmonary effort is normal.  Musculoskeletal:       Legs:  Skin:    General: Skin is warm and dry.  Neurological:     General: No focal deficit present.     Mental Status: She is alert and oriented to person, place, and time.  Psychiatric:        Mood and Affect: Mood normal.        Behavior: Behavior normal.    DG Foot Complete Left  Result Date: 11/05/2020 CLINICAL DATA:  Left foot  pain.  Right fourth toe swelling. EXAM: LEFT FOOT - COMPLETE 3+ VIEW COMPARISON:  None. FINDINGS: Minimally displaced fracture involving the fourth toe proximal phalanx, only seen on the oblique image. No other definite fractures. IMPRESSION: Minimally displaced fracture involving the fourth toe proximal phalanx. Electronically Signed   By: Markus Daft M.D.   On: 11/05/2020 14:55     Assessment and Plan :   PDMP not reviewed this encounter.  1. Toe pain, right   2. Closed displaced fracture of phalanx of lesser toe of right foot, unspecified phalanx, initial encounter     Recommended follow-up with podiatry, naproxen for pain and inflammation.  No history of heart disease, kidney disease.  Wear postop shoe in the meantime. Counseled patient on potential for adverse effects with medications prescribed/recommended today, ER and return-to-clinic precautions discussed,  patient verbalized understanding.    Jaynee Eagles, [place] 11/05/20 3875

## 2020-11-05 NOTE — ED Triage Notes (Signed)
Pt in with c/o right 4th toe pain that started 2 weeks ago after she stubbed it against shower  Pt states It has been swollen and painful since  Pt has been taking tylenol with no relief

## 2021-05-12 ENCOUNTER — Encounter: Payer: Self-pay | Admitting: Emergency Medicine

## 2021-05-12 ENCOUNTER — Other Ambulatory Visit: Payer: Self-pay

## 2021-05-12 ENCOUNTER — Ambulatory Visit
Admission: EM | Admit: 2021-05-12 | Discharge: 2021-05-12 | Disposition: A | Discharge status: Home / Self Care | Payer: PRIVATE HEALTH INSURANCE | Attending: Physician Assistant | Admitting: Physician Assistant

## 2021-05-12 DIAGNOSIS — M25511 Pain in right shoulder: Secondary | ICD-10-CM

## 2021-05-12 MED ORDER — KETOROLAC TROMETHAMINE 30 MG/ML IJ SOLN
30.0000 mg | Freq: Once | INTRAMUSCULAR | Status: AC
  Administered 2021-05-12: 30 mg via INTRAMUSCULAR

## 2021-05-12 MED ORDER — PREDNISONE 20 MG PO TABS: 40.0000 mg | ORAL_TABLET | Freq: Every day | ORAL | 0 refills | Status: AC

## 2021-05-12 MED ORDER — CYCLOBENZAPRINE HCL 10 MG PO TABS: 10.0000 mg | ORAL_TABLET | Freq: Two times a day (BID) | ORAL | 0 refills | Status: AC | PRN

## 2021-05-12 NOTE — ED Provider Notes (Signed)
[facility]    CSN: [id number] Arrival date & time: 05/12/21  0804      History   Chief Complaint Chief Complaint  Patient presents with   Shoulder Pain    HPI Brooke Guerra is a 61 y.o. female.   Patient here today for evaluation of right shoulder pain that started 3 days ago. She reports that pain radiates from shoulder into elbow and feels like pins. She reports that movement of her right arm makes symptoms worse. She denies shortness of breath, nausea, vomiting. She has not had fever. She denies any injury.   The history is provided by the patient.  Shoulder Pain Associated symptoms: no fever    Past Medical History:  Diagnosis Date   Asthma    GERD (gastroesophageal reflux disease)    Hypertension     Patient Active Problem List   Diagnosis Date Noted   Achalasia s/p Heller myotomy and Dor fundoplication 08/09/54 81/27/5170   Chest pain 07/23/2012    Past Surgical History:  Procedure Laterality Date   ABDOMINAL HYSTERECTOMY     ESOPHAGEAL MANOMETRY N/A 08/02/2012   Procedure: ESOPHAGEAL MANOMETRY (EM);  Surgeon: Jerene Bears, MD;  Location: [facility];  Service: Gastroenterology;  Laterality: N/A;   ESOPHAGOGASTRODUODENOSCOPY (EGD) WITH ESOPHAGEAL DILATION N/A 07/25/2012   Procedure: ESOPHAGOGASTRODUODENOSCOPY (EGD) WITH ESOPHAGEAL DILATION;  Surgeon: Jerene Bears, MD;  Location: [facility];  Service: Gastroenterology;  Laterality: N/A;   ESOPHAGOGASTRODUODENOSCOPY ENDOSCOPY N/A 09/07/2012   Procedure: Upper Endoscopy;  Surgeon: Odis Hollingshead, MD;  Location: [facility];  Service: General;  Laterality: N/A;   HELLER MYOTOMY N/A 09/07/2012   Procedure: LAPAROSCOPIC HELLER MYOTOMY;  Surgeon: Odis Hollingshead, MD;  Location: [facility];  Service: General;  Laterality: N/A;   TOE SURGERY     toe fx    OB History     Gravida  4   Para  4   Term  4   Preterm      AB      Living  4      SAB      IAB      Ectopic      Multiple      Live  Births               Home Medications    Prior to Admission medications   Medication Sig Start Date End Date Taking? Authorizing Provider  cyclobenzaprine (FLEXERIL) 10 MG tablet Take 1 tablet (10 mg total) by mouth 2 (two) times daily as needed for muscle spasms. 05/12/21  Yes Francene Finders, PA-C  predniSONE (DELTASONE) 20 MG tablet Take 2 tablets (40 mg total) by mouth daily with breakfast for 5 days. 05/12/21 05/17/21 Yes Francene Finders, PA-C  acetaminophen (TYLENOL) 500 MG tablet Take 1,000 mg by mouth every 6 (six) hours as needed for moderate pain.    [provider]  HYDROcodone-acetaminophen (NORCO) 5-325 MG tablet Take 1 tablet by mouth every 6 (six) hours as needed for moderate pain. 04/28/19   Robyn Haber, MD  MOVIPREP 100 G SOLR Take 1 kit (200 g total) by mouth once. 10/31/14   Lafayette Dragon, MD  naproxen (NAPROSYN) 375 MG tablet Take 1 tablet (375 mg total) by mouth 2 (two) times daily with a meal. 11/05/20   Jaynee Eagles, PA-C    Family History Family History  Problem Relation Age of Onset   Diabetes Sister    Cancer Sister  unknown   Heart attack Maternal Uncle 61   Colon cancer Neg Hx     Social History Social History   Tobacco Use   Smoking status: Every Day    Packs/day: 0.25    Years: 20.00    Pack years: 5.00    Types: Cigarettes   Smokeless tobacco: Never  Substance Use Topics   Alcohol use: Yes    Alcohol/week: 0.0 standard drinks    Comment: occassional;holidays   Drug use: No     Allergies   Patient has no known allergies.   Review of Systems Review of Systems  Constitutional:  Negative for chills and fever.  Eyes:  Negative for discharge and redness.  Respiratory:  Negative for shortness of breath.   Gastrointestinal:  Negative for abdominal pain, nausea and vomiting.  Musculoskeletal:  Positive for arthralgias. Negative for joint swelling.  Neurological:  Negative for numbness.    Physical Exam Triage Vital  Signs ED Triage Vitals [05/12/21 0819]  Enc Vitals Group     BP (!) 152/95     Pulse Rate 77     Resp 16     Temp 98.6 F (37 C)     Temp Source Oral     SpO2 97 %     Weight      Height      Head Circumference      Peak Flow      Pain Score 10     Pain Loc      Pain Edu?      Excl. in [place]?    No data found.  Updated Vital Signs BP (!) 152/95 (BP Location: Right Arm)    Pulse 77    Temp 98.6 F (37 C) (Oral)    Resp 16    SpO2 97%     Physical Exam Vitals and nursing note reviewed.  Constitutional:      General: She is not in acute distress.    Appearance: Normal appearance. She is not ill-appearing.  HENT:     Head: Normocephalic and atraumatic.  Eyes:     Conjunctiva/sclera: Conjunctivae normal.  Cardiovascular:     Rate and Rhythm: Normal rate.  Pulmonary:     Effort: Pulmonary effort is normal.  Musculoskeletal:     Comments: Decreased ROM of right shoulder in all directions due to pain  Neurological:     Mental Status: She is alert.  Psychiatric:        Mood and Affect: Mood normal.        Behavior: Behavior normal.        Thought Content: Thought content normal.     UC Treatments / Results  Labs (all labs ordered are listed, but only abnormal results are displayed) Labs Reviewed - No data to display  EKG   Radiology No results found.  Procedures Procedures (including critical care time)  Medications Ordered in UC Medications  ketorolac (TORADOL) 30 MG/ML injection 30 mg (30 mg Intramuscular Given 05/12/21 0855)    Initial Impression / Assessment and Plan / UC Course  I have reviewed the triage vital signs and the nursing notes.  Pertinent labs & imaging results that were available during my care of the patient were reviewed by me and considered in my medical decision making (see chart for details).    Unknown cause of symtpoms-- questions arthritis flare vs frozen shoulder-- will treat with steroids and muscle relaxer. Recommend follow up  with ortho with any worsening or any  further concerns.   Final Clinical Impressions(s) / UC Diagnoses   Final diagnoses:  Acute pain of right shoulder     Discharge Instructions      Use muscle relaxer with caution as it may cause drowsiness. Follow up with ortho if symptoms do not improve over the next week.      ED Prescriptions     Medication Sig Dispense Auth. Provider   predniSONE (DELTASONE) 20 MG tablet Take 2 tablets (40 mg total) by mouth daily with breakfast for 5 days. 10 tablet Ewell Poe F, PA-C   cyclobenzaprine (FLEXERIL) 10 MG tablet Take 1 tablet (10 mg total) by mouth 2 (two) times daily as needed for muscle spasms. 20 tablet Francene Finders, PA-C      PDMP not reviewed this encounter.   Francene Finders, PA-C 05/12/21 [id number]

## 2021-05-12 NOTE — Discharge Instructions (Signed)
Use muscle relaxer with caution as it may cause drowsiness. Follow up with ortho if symptoms do not improve over the next week.

## 2021-05-12 NOTE — ED Triage Notes (Signed)
Pt said since Thursday she has been having right shoulder pain into the elbow. Pt said no injury. Pt said the pain is like a toothache.

## 2023-09-06 ENCOUNTER — Emergency Department (HOSPITAL_COMMUNITY): Payer: Self-pay

## 2023-09-06 ENCOUNTER — Other Ambulatory Visit: Payer: Self-pay

## 2023-09-06 ENCOUNTER — Emergency Department (HOSPITAL_COMMUNITY)
Admission: EM | Admit: 2023-09-06 | Discharge: 2023-09-06 | Disposition: A | Discharge status: Home / Self Care | Payer: Self-pay | Attending: Emergency Medicine | Admitting: Emergency Medicine

## 2023-09-06 DIAGNOSIS — S90932A Unspecified superficial injury of left great toe, initial encounter: Secondary | ICD-10-CM | POA: Diagnosis present

## 2023-09-06 DIAGNOSIS — I10 Essential (primary) hypertension: Secondary | ICD-10-CM | POA: Insufficient documentation

## 2023-09-06 DIAGNOSIS — S90452A Superficial foreign body, left great toe, initial encounter: Secondary | ICD-10-CM | POA: Diagnosis not present

## 2023-09-06 DIAGNOSIS — Z79899 Other long term (current) drug therapy: Secondary | ICD-10-CM | POA: Diagnosis not present

## 2023-09-06 DIAGNOSIS — X58XXXA Exposure to other specified factors, initial encounter: Secondary | ICD-10-CM | POA: Insufficient documentation

## 2023-09-06 DIAGNOSIS — Z1810 Retained metal fragments, unspecified: Secondary | ICD-10-CM | POA: Diagnosis not present

## 2023-09-06 DIAGNOSIS — M795 Residual foreign body in soft tissue: Secondary | ICD-10-CM

## 2023-09-06 MED ORDER — CHLORTHALIDONE 25 MG PO TABS: 25.0000 mg | ORAL_TABLET | Freq: Every day | ORAL | 1 refills | Status: AC

## 2023-09-06 NOTE — ED Notes (Signed)
 Pt verbalized understanding of discharge instructions. Opportunity for questions provided.

## 2023-09-06 NOTE — ED Provider Notes (Signed)
 [facility] AT [facility] Provider Note   CSN: [id number] Arrival date & time: 09/06/23  0746     History  Chief Complaint  Patient presents with   Toe Injury    Brooke Guerra is a 63 y.o. female with overall expiratory past medical history who presents concern for surgical pain in the left great toe that was starting to work its way out, but she got it caught on her slipper this morning and has pulled nearly all the way out.  She rates 4/10 pain when bending the toe, no significant pain at rest.  No swelling or drainage, placed in [place]  30 years ago.  She reports her tetanus is up-to-date, she had a shot 4 years ago.  HPI     Home Medications Prior to Admission medications   Medication Sig Start Date End Date Taking? Authorizing Provider  chlorthalidone (HYGROTON) 25 MG tablet Take 1 tablet (25 mg total) by mouth daily. 09/06/23  Yes Alexa Golebiewski H, PA-C  acetaminophen  (TYLENOL ) 500 MG tablet Take 1,000 mg by mouth every 6 (six) hours as needed for moderate pain.    [provider]  cyclobenzaprine  (FLEXERIL ) 10 MG tablet Take 1 tablet (10 mg total) by mouth 2 (two) times daily as needed for muscle spasms. 05/12/21   Vernestine Gondola, PA-C  HYDROcodone -acetaminophen  (NORCO) 5-325 MG tablet Take 1 tablet by mouth every 6 (six) hours as needed for moderate pain. 04/28/19   Dain Drown, MD  MOVIPREP  100 G SOLR Take 1 kit (200 g total) by mouth once. 10/31/14   Pietro Bridegroom, MD  naproxen  (NAPROSYN ) 375 MG tablet Take 1 tablet (375 mg total) by mouth 2 (two) times daily with a meal. 11/05/20   Adolph Hoop, PA-C      Allergies    Patient has no known allergies.    Review of Systems   Review of Systems  All other systems reviewed and are negative.   Physical Exam Updated Vital Signs BP (S) (!) 178/109 (BP Location: Right Arm) Comment: does not take BP meds, no hx of high bp per pt  Pulse 82   Temp 98 F (36.7 C) (Oral)   Resp 18    Ht 5' 4.5" (1.638 m)   Wt 80.7 kg   SpO2 100%   BMI 30.08 kg/m  Physical Exam Vitals and nursing note reviewed.  Constitutional:      General: She is not in acute distress.    Appearance: Normal appearance.  HENT:     Head: Normocephalic and atraumatic.  Eyes:     General:        Right eye: No discharge.        Left eye: No discharge.  Cardiovascular:     Rate and Rhythm: Normal rate and regular rhythm.  Pulmonary:     Effort: Pulmonary effort is normal. No respiratory distress.  Musculoskeletal:        General: No deformity.     Comments: Surgical pin protruding from left great toe is loose, suspect that it is no longer retained in bony fragment, likely sitting in soft tissue, will obtain x-ray to confirm position.  Skin:    General: Skin is warm and dry.  Neurological:     Mental Status: She is alert and oriented to person, place, and time.  Psychiatric:        Mood and Affect: Mood normal.        Behavior: Behavior normal.  ED Results / Procedures / Treatments   Labs (all labs ordered are listed, but only abnormal results are displayed) Labs Reviewed - No data to display  EKG None  Radiology DG Foot Complete Left Result Date: 09/06/2023 CLINICAL DATA:  Retained surgical metal. EXAM: LEFT FOOT - COMPLETE 3+ VIEW COMPARISON:  None Available. FINDINGS: Three views study shows a small screw in the dorsal soft tissues of the great toe, the level of the interphalangeal joint. Lateral film suggests that the screw is extraosseous. IMPRESSION: Small screw in the dorsal soft tissues of the great toe, at the level of the interphalangeal joint. Electronically Signed   By: Donnal Fusi M.D.   On: 09/06/2023 08:22    Procedures .Foreign Body Removal  Date/Time: 09/06/2023 8:45 AM  Performed by: Nelly Banco, PA-C Authorized by: Nelly Banco, PA-C  Consent: Verbal consent obtained. Risks and benefits: risks, benefits and alternatives were  discussed Consent given by: patient Patient understanding: patient states understanding of the procedure being performed Patient identity confirmed: verbally with patient Body area: skin General location: lower extremity Location details: left big toe  Sedation: Patient sedated: no  Patient restrained: no Patient cooperative: yes 1 objects recovered. Objects recovered: Surgical Screw Post-procedure assessment: foreign body removed Patient tolerance: patient tolerated the procedure well with no immediate complications      Medications Ordered in ED Medications - No data to display  ED Course/ Medical Decision Making/ A&P Clinical Course as of 09/06/23 0848  Sun Sep 06, 2023  1610 Cherl Corner [CP]    Clinical Course User Index [CP] Nelly Banco, PA-C                                 Medical Decision Making Amount and/or Complexity of Data Reviewed Radiology: ordered.  This patient is a 63 y.o. female who presents to the ED for concern of retained foreign body in left great toe secondary to surgical procedure,   Differential diagnoses prior to evaluation: peri orthopedic material fracture, versus other complication, versus simple foreign body, versus infection associated with hardware  Past Medical History / Social History / Additional history: Chart reviewed. Pertinent results include: Hypertension, acid reflux, not currently taking blood pressure medication.  Physical Exam: Physical exam performed. The pertinent findings include: Quite hypertensive in the emergency department, blood pressure 178/109 on arrival.  Surgical pin protruding from left great toe is loose, suspect that it is no longer retained in bony fragment, likely sitting in soft tissue, will obtain x-ray to confirm position.   Medications / Treatment: Consults, I spoke with Dr. Harron Brick with orthopedics who agrees patient is stable to have the orthopedic hardware removed as long as she can tolerate  the procedure, can follow-up in office with Dr. Cherl Corner on if she needs further evaluation.  Area where surgical hardware was removed was bandaged with bacitracin, patient discharged in stable condition.   Disposition: After consideration of the diagnostic results and the patients response to treatment, I feel that patient stable for discharge with plan as above .   emergency department workup does not suggest an emergent condition requiring admission or immediate intervention beyond what has been performed at this time. The plan is: as above. The patient is safe for discharge and has been instructed to return immediately for worsening symptoms, change in symptoms or any other concerns.  Final Clinical Impression(s) / ED Diagnoses Final diagnoses:  Foreign body (FB) in  soft tissue  Uncontrolled hypertension    Rx / DC Orders ED Discharge Orders          Ordered    chlorthalidone (HYGROTON) 25 MG tablet  Daily        09/06/23 0842              Nelly Banco, PA-C 09/06/23 4098    Auston Blush, MD 09/07/23 1450

## 2023-09-06 NOTE — Discharge Instructions (Signed)
 Please use Tylenol  or ibuprofen  for pain.  You may use 600 mg ibuprofen  every 6 hours or 1000 mg of Tylenol  every 6 hours.  You may choose to alternate between the 2.  This would be most effective.  Not to exceed 4 g of Tylenol  within 24 hours.  Not to exceed 3200 mg ibuprofen  24 hours.  I would keep some bacitracin and a bandage over the wound, please follow-up with the orthopedic physician whose contact Brooke Guerra 9 provided above to ensure that the wound is healing appropriately sometime in the next 1 to 2 weeks.

## 2023-09-06 NOTE — ED Triage Notes (Signed)
 Surgical pin in left great toe has worked it's way out of toe-- was placed approx 17yrs ago. No swelling, no drainage, was starting to come out and it got caught on slipper.

## 2024-04-04 ENCOUNTER — Ambulatory Visit: Payer: Self-pay | Admitting: Emergency Medicine

## 2024-04-04 ENCOUNTER — Ambulatory Visit
Admission: EM | Admit: 2024-04-04 | Discharge: 2024-04-04 | Disposition: A | Discharge status: Home / Self Care | Attending: Emergency Medicine | Admitting: Emergency Medicine

## 2024-04-04 ENCOUNTER — Ambulatory Visit (INDEPENDENT_AMBULATORY_CARE_PROVIDER_SITE_OTHER)

## 2024-04-04 DIAGNOSIS — I10 Essential (primary) hypertension: Secondary | ICD-10-CM | POA: Insufficient documentation

## 2024-04-04 DIAGNOSIS — R350 Frequency of micturition: Secondary | ICD-10-CM | POA: Diagnosis not present

## 2024-04-04 DIAGNOSIS — J209 Acute bronchitis, unspecified: Secondary | ICD-10-CM | POA: Insufficient documentation

## 2024-04-04 DIAGNOSIS — R051 Acute cough: Secondary | ICD-10-CM | POA: Diagnosis not present

## 2024-04-04 DIAGNOSIS — M549 Dorsalgia, unspecified: Secondary | ICD-10-CM | POA: Insufficient documentation

## 2024-04-04 LAB — POCT URINE DIPSTICK
Bilirubin, UA: NEGATIVE
Glucose, UA: NEGATIVE mg/dL
Ketones, POC UA: NEGATIVE mg/dL
Nitrite, UA: NEGATIVE
Spec Grav, UA: 1.015 (ref 1.010–1.025)
Urobilinogen, UA: 0.2 U/dL
pH, UA: 7 (ref 5.0–8.0)

## 2024-04-04 MED ORDER — METHOCARBAMOL 500 MG PO TABS: 500.0000 mg | ORAL_TABLET | Freq: Two times a day (BID) | ORAL | 0 refills | Status: AC

## 2024-04-04 MED ORDER — PREDNISONE 20 MG PO TABS: 40.0000 mg | ORAL_TABLET | Freq: Every day | ORAL | 0 refills | Status: AC

## 2024-04-04 MED ORDER — PROMETHAZINE-DM 6.25-15 MG/5ML PO SYRP: 5.0000 mL | ORAL_SOLUTION | Freq: Four times a day (QID) | ORAL | 0 refills | Status: AC | PRN
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
## 2024-04-04 NOTE — ED Triage Notes (Signed)
 Patient reports being in [facility] on Friday, now having Back Pain/Neck pain. Also, while I am here I have had a cold for 2 wks, productive cough, congestion, no fever.

## 2024-04-04 NOTE — Discharge Instructions (Addendum)
 I did not see pneumonia on your chest x-ray.  Take the prednisone  today and then daily with breakfast to help reduce inflammation in the lungs.  You can use the cough medicine up to 4 times daily, do not drink alcohol or drive on this medicine because it can cause sedation.  Ensure you are drinking at least 64 ounces of water daily to help loosen up secretions.  For your upper back pain you can take the Robaxin up to 2 times daily.  This is a muscle relaxer and can cause drowsiness.  I also suggest warm compresses and gentle stretching to help loosen up the stiff muscles.  We are sending your urine off for culture and we will contact you if you need antibiotics.  Cough and pain should get better with treatment.  If no improvement or any changes you can return to clinic for reevaluation.  Your blood pressure was elevated today in clinic.  Please follow-up with your primary care provider regarding ongoing management of this chronic disease, hypertension.  Seek immediate care if you develop vision loss, severe headache, or new concerning symptoms.

## 2024-04-05 LAB — URINE CULTURE: Culture: NO GROWTH
